# Patient Record
Sex: Female | Born: 1970
Health system: Southern US, Community
[De-identification: ages and names within clinical notes are randomized; demographics above are authoritative.]

## PROBLEM LIST (undated history)

## (undated) DIAGNOSIS — E079 Disorder of thyroid, unspecified: Secondary | ICD-10-CM

## (undated) HISTORY — PX: OTHER SURGICAL HISTORY: SHX169

## (undated) HISTORY — PX: ANTERIOR CRUCIATE LIGAMENT REPAIR: SHX115

---

## 1998-03-31 ENCOUNTER — Other Ambulatory Visit: Admission: RE | Admit: 1998-03-31 | Discharge: 1998-03-31 | Payer: Self-pay | Admitting: Obstetrics and Gynecology

## 1999-02-06 ENCOUNTER — Inpatient Hospital Stay (HOSPITAL_COMMUNITY): Admission: AD | Admit: 1999-02-06 | Discharge: 1999-02-08 | Payer: Self-pay | Admitting: Obstetrics and Gynecology

## 1999-02-06 ENCOUNTER — Encounter (INDEPENDENT_AMBULATORY_CARE_PROVIDER_SITE_OTHER): Payer: Self-pay

## 1999-02-10 ENCOUNTER — Encounter: Admission: RE | Admit: 1999-02-10 | Discharge: 1999-05-11 | Payer: Self-pay | Admitting: Obstetrics and Gynecology

## 1999-03-06 ENCOUNTER — Other Ambulatory Visit: Admission: RE | Admit: 1999-03-06 | Discharge: 1999-03-06 | Payer: Self-pay | Admitting: Obstetrics and Gynecology

## 2000-03-30 ENCOUNTER — Other Ambulatory Visit: Admission: RE | Admit: 2000-03-30 | Discharge: 2000-03-30 | Payer: Self-pay | Admitting: Obstetrics and Gynecology

## 2001-03-03 ENCOUNTER — Encounter: Payer: Self-pay | Admitting: Family Medicine

## 2001-03-03 ENCOUNTER — Encounter: Admission: RE | Admit: 2001-03-03 | Discharge: 2001-03-03 | Payer: Self-pay | Admitting: Family Medicine

## 2002-04-23 ENCOUNTER — Encounter (INDEPENDENT_AMBULATORY_CARE_PROVIDER_SITE_OTHER): Payer: Self-pay | Admitting: Specialist

## 2002-04-23 ENCOUNTER — Inpatient Hospital Stay (HOSPITAL_COMMUNITY): Admission: AD | Admit: 2002-04-23 | Discharge: 2002-04-24 | Payer: Self-pay | Admitting: Obstetrics and Gynecology

## 2003-11-21 ENCOUNTER — Ambulatory Visit: Payer: Self-pay | Admitting: Endocrinology

## 2004-04-22 ENCOUNTER — Ambulatory Visit: Payer: Self-pay | Admitting: Endocrinology

## 2004-06-15 ENCOUNTER — Ambulatory Visit: Payer: Self-pay | Admitting: Endocrinology

## 2004-07-29 ENCOUNTER — Ambulatory Visit: Payer: Self-pay | Admitting: Endocrinology

## 2004-10-09 ENCOUNTER — Encounter: Admission: RE | Admit: 2004-10-09 | Discharge: 2004-10-09 | Payer: Self-pay | Admitting: Obstetrics and Gynecology

## 2005-04-21 ENCOUNTER — Other Ambulatory Visit: Admission: RE | Admit: 2005-04-21 | Discharge: 2005-04-21 | Payer: Self-pay | Admitting: Family Medicine

## 2005-12-17 ENCOUNTER — Encounter: Admission: RE | Admit: 2005-12-17 | Discharge: 2005-12-17 | Payer: Self-pay | Admitting: Family Medicine

## 2006-04-25 ENCOUNTER — Other Ambulatory Visit: Admission: RE | Admit: 2006-04-25 | Discharge: 2006-04-25 | Payer: Self-pay | Admitting: Family Medicine

## 2007-04-25 ENCOUNTER — Other Ambulatory Visit: Admission: RE | Admit: 2007-04-25 | Discharge: 2007-04-25 | Payer: Self-pay | Admitting: Family Medicine

## 2008-09-05 ENCOUNTER — Other Ambulatory Visit: Admission: RE | Admit: 2008-09-05 | Discharge: 2008-09-05 | Payer: Self-pay | Admitting: Family Medicine

## 2009-05-05 ENCOUNTER — Encounter: Admission: RE | Admit: 2009-05-05 | Discharge: 2009-05-05 | Payer: Self-pay | Admitting: Family Medicine

## 2009-09-10 ENCOUNTER — Other Ambulatory Visit: Admission: RE | Admit: 2009-09-10 | Discharge: 2009-09-10 | Payer: Self-pay | Admitting: Family Medicine

## 2010-04-14 ENCOUNTER — Other Ambulatory Visit: Payer: Self-pay | Admitting: Family Medicine

## 2010-04-14 DIAGNOSIS — Z1231 Encounter for screening mammogram for malignant neoplasm of breast: Secondary | ICD-10-CM

## 2010-05-07 ENCOUNTER — Ambulatory Visit: Payer: Self-pay

## 2010-05-22 NOTE — Discharge Summary (Signed)
[facility]  Patient:    Dana Harmon                        MRN: [id number] Adm. Date:  [id number] Disc. Date: [id number] Attending:  Maxie Better                           Discharge Summary  ADMISSION DIAGNOSES:          1. Advanced cervical dilatation.                               2. Term gestation.                               3. Hypothyroidism.                               4. Bilateral fetal hydrocele.  DISCHARGE DIAGNOSES:          1. Term gestation, delivered.                               2. Hypothyroidism.                               3. Vacuum-assisted vaginal delivery.                               4. Repair of midline episiotomy.  HISTORY OF PRESENT ILLNESS:   This is a 40 year old, gravida 1, para 0, female t 39-3/[redacted] weeks gestation admitted for induction of labor secondary to advanced cervical dilatation.  The patient had presented for routine obstetrical care on  February 06, 1999, and was found to be 5 cm, 90%, and +1 station.  The patient had been followed by serial ultrasounds due to grade III placenta initially noted around 28 weeks.  Her pregnancy course had otherwise been unremarkable.  Blood ype is A positive, antibody screen negative, RPR nonreactive.  Rubella immune.  The  patient has history of hypothyroidism controlled on Synthroid and managed by Lindell Spar. Chestine Spore, M.D.  HOSPITAL COURSE:              The patient was admitted and placed on a monitor.  Reactive NST was noted.  Artificial rupture of membranes was performed.  Clear amniotic fluid present.  Her Group B Strep culture was negative.  The patient had onset of labor about 20 minutes after rupture of membranes.  She was fully dilated an hour and 15 minutes later.  The patient pushed for 2+ hours without any epidural anesthesia and was thought to have inadequate pulsatile force despite trying different positions for pushing.  Dressing remained reactive.   Contractions were every two minutes.  Epidural subsequently placed.  Vaginal examination, the patient was fully, +1 caput, 2+ station, left occipitoanterior presentation with not much forcep noted with contractions on digital examination or with palpation of the abdomen.  Therefore, an intrauterine pressure catheter was placed.  The [place] units were 20 per contractions.  Each contraction were then two to three minutes. It was felt that the patient had arrest of  descent secondary to inadequate contractile pattern and strength.  Pitocin augmentation was started with high dose protocol with improvement in the uterine contraction pattern and strength.  The  patient began pushing.  The patient pushed for about two hours.  Her second stage at this point was 7+ hours.  Concern arose about a possible increased uterine atony with resultant postpartum hemorrhage.  It was discussed with the patient. Advised vacuum extraction.  At that time her cervical examination was fully, +3 station, with +1 caput, and left occipitoanterior, visible with pushing.  The bladder was catheterized.  The patient agreed for vacuum, pulled with four contractions, popped off of the vacuum x 1.  Vaginal delivery occured direct occipitoanterior, live ale over a midline episiotomy at 8:25 p.m.  Pediatricians were in attendance.  The fetal heart rate had decreased to 105 with pushing during the vacuum procedure.  Apgars of 8 and 9.  Cord pH was 7.18.  The placenta was spontaneous, intact, and sent to pathology.  It was mature placenta with a small infarct and a 7.5 cm accessory lobe.  The midline episiotomy was inspected and no extension noted. o cervical or vaginal lacerations were noted.  The episiotomy was repaired in layers. Weight of the baby was 8 pounds 3 ounces.  She had an unremarkable postpartum course.  CBC on postpartum day #1 showed a hematocrit of 34.7, hemoglobin 12.1,  white count 15.2,  and platelet count 181,000.  The patient was continued on her  Synthroid dose as prepregnancy.  She was stable for discharge on postpartum day #2.  DISPOSITION:                  Home.  CONDITION ON DISCHARGE:       Stable.  DISCHARGE MEDICATIONS:        1. Prenatal vitamins one p.o. q.d.                               2. Motrin 800 mg every 6 to 8 hours p.r.n. pain.                               3. Continue with Synthroid dose as previously                                  prescribed.  DISCHARGE INSTRUCTIONS:       Call for temperature greater than or equal to 100.4, soaking a regular pad every hour or more frequently, nothing per vagina for four to six weeks, breast redness or pain. DD:  03/07/99 TD:  03/09/99 Job: [id number]

## 2010-06-03 ENCOUNTER — Ambulatory Visit: Payer: Self-pay

## 2010-06-10 ENCOUNTER — Ambulatory Visit: Payer: Self-pay

## 2010-06-10 ENCOUNTER — Ambulatory Visit
Admission: RE | Admit: 2010-06-10 | Discharge: 2010-06-10 | Disposition: A | Discharge status: Home / Self Care | Payer: BC Managed Care – PPO | Source: Ambulatory Visit | Attending: Family Medicine | Admitting: Family Medicine

## 2010-06-10 DIAGNOSIS — Z1231 Encounter for screening mammogram for malignant neoplasm of breast: Secondary | ICD-10-CM

## 2011-09-21 ENCOUNTER — Other Ambulatory Visit: Payer: Self-pay | Admitting: Family Medicine

## 2011-09-21 DIAGNOSIS — Z1231 Encounter for screening mammogram for malignant neoplasm of breast: Secondary | ICD-10-CM

## 2011-10-15 ENCOUNTER — Ambulatory Visit: Payer: BC Managed Care – PPO

## 2011-11-05 ENCOUNTER — Ambulatory Visit: Payer: BC Managed Care – PPO

## 2012-02-04 ENCOUNTER — Ambulatory Visit
Admission: RE | Admit: 2012-02-04 | Discharge: 2012-02-04 | Disposition: A | Discharge status: Home / Self Care | Payer: BC Managed Care – PPO | Source: Ambulatory Visit | Attending: Family Medicine | Admitting: Family Medicine

## 2012-02-04 DIAGNOSIS — Z1231 Encounter for screening mammogram for malignant neoplasm of breast: Secondary | ICD-10-CM

## 2012-10-05 ENCOUNTER — Other Ambulatory Visit: Payer: Self-pay | Admitting: Family Medicine

## 2012-10-05 ENCOUNTER — Other Ambulatory Visit (HOSPITAL_COMMUNITY)
Admission: RE | Admit: 2012-10-05 | Discharge: 2012-10-05 | Disposition: A | Discharge status: Home / Self Care | Payer: BC Managed Care – PPO | Source: Ambulatory Visit | Attending: Family Medicine | Admitting: Family Medicine

## 2012-10-05 DIAGNOSIS — Z124 Encounter for screening for malignant neoplasm of cervix: Secondary | ICD-10-CM | POA: Insufficient documentation

## 2015-01-09 ENCOUNTER — Other Ambulatory Visit: Payer: Self-pay

## 2015-01-09 DIAGNOSIS — Z1231 Encounter for screening mammogram for malignant neoplasm of breast: Secondary | ICD-10-CM

## 2015-01-31 ENCOUNTER — Ambulatory Visit
Admission: RE | Admit: 2015-01-31 | Discharge: 2015-01-31 | Disposition: A | Discharge status: Home / Self Care | Payer: BLUE CROSS/BLUE SHIELD | Source: Ambulatory Visit

## 2015-01-31 DIAGNOSIS — Z1231 Encounter for screening mammogram for malignant neoplasm of breast: Secondary | ICD-10-CM

## 2016-12-10 DIAGNOSIS — M25552 Pain in left hip: Secondary | ICD-10-CM | POA: Diagnosis not present

## 2016-12-10 DIAGNOSIS — M545 Low back pain: Secondary | ICD-10-CM | POA: Diagnosis not present

## 2016-12-10 DIAGNOSIS — M5106 Intervertebral disc disorders with myelopathy, lumbar region: Secondary | ICD-10-CM | POA: Diagnosis not present

## 2017-01-18 ENCOUNTER — Other Ambulatory Visit (HOSPITAL_COMMUNITY)
Admission: RE | Admit: 2017-01-18 | Discharge: 2017-01-18 | Disposition: A | Discharge status: Home / Self Care | Payer: BLUE CROSS/BLUE SHIELD | Source: Ambulatory Visit | Attending: Family Medicine | Admitting: Family Medicine

## 2017-01-18 ENCOUNTER — Other Ambulatory Visit: Payer: Self-pay | Admitting: Family Medicine

## 2017-01-18 DIAGNOSIS — Z131 Encounter for screening for diabetes mellitus: Secondary | ICD-10-CM | POA: Diagnosis not present

## 2017-01-18 DIAGNOSIS — Z0000 Encounter for general adult medical examination without abnormal findings: Secondary | ICD-10-CM | POA: Diagnosis not present

## 2017-01-18 DIAGNOSIS — Z23 Encounter for immunization: Secondary | ICD-10-CM | POA: Diagnosis not present

## 2017-01-18 DIAGNOSIS — Z124 Encounter for screening for malignant neoplasm of cervix: Secondary | ICD-10-CM | POA: Insufficient documentation

## 2017-01-18 DIAGNOSIS — E039 Hypothyroidism, unspecified: Secondary | ICD-10-CM | POA: Diagnosis not present

## 2017-01-18 DIAGNOSIS — Z136 Encounter for screening for cardiovascular disorders: Secondary | ICD-10-CM | POA: Diagnosis not present

## 2017-01-20 LAB — CYTOLOGY - PAP
Diagnosis: NEGATIVE
HPV (WINDOPATH): NOT DETECTED

## 2017-03-18 ENCOUNTER — Encounter (HOSPITAL_COMMUNITY): Payer: Self-pay | Admitting: Emergency Medicine

## 2017-03-18 ENCOUNTER — Ambulatory Visit (HOSPITAL_COMMUNITY)
Admission: EM | Admit: 2017-03-18 | Discharge: 2017-03-18 | Disposition: A | Discharge status: Home / Self Care | Payer: BLUE CROSS/BLUE SHIELD | Attending: Family Medicine | Admitting: Family Medicine

## 2017-03-18 DIAGNOSIS — R21 Rash and other nonspecific skin eruption: Secondary | ICD-10-CM | POA: Diagnosis not present

## 2017-03-18 MED ORDER — TRIAMCINOLONE ACETONIDE 0.1 % EX CREA: 1.0000 "application " | TOPICAL_CREAM | Freq: Two times a day (BID) | CUTANEOUS | 0 refills | Status: DC

## 2017-03-18 MED ORDER — MUPIROCIN 2 % EX OINT: 1.0000 "application " | TOPICAL_OINTMENT | Freq: Two times a day (BID) | CUTANEOUS | 0 refills | Status: DC

## 2017-03-18 NOTE — Discharge Instructions (Signed)
Rash looks more from irritation/dry skin. Apply triamcinolone cream to affected area. You can take over the counter antihistamines (allergy medicine) such as zyrtec to help with the itchiness as well. You can use bactroban ointment on the boils at the lower area that could be due to folliculitis. Monitor for spreading redness, increased warmth, fever, follow up for reevaluation.

## 2017-03-18 NOTE — ED Triage Notes (Signed)
PT reports rash on buttocks that may be infected. Has been present for a few weeks

## 2017-03-18 NOTE — ED Provider Notes (Signed)
[facility]    CSN: [id number] Arrival date & time: 03/18/17  1334     History   Chief Complaint Chief Complaint  Patient presents with  . Rash    HPI Dana Harmon is a 47 y.o. female.   47 year old female comes in for few week history of of rash to the buttocks.  States it started out as pruritic rash, and now slightly painful.  Thought at first it was keratosis pilaris that she has on her arms,  and put on cream without improvement.  Started applying Neosporin without improvement and came in for evaluation.  She denies spreading erythema, increased warmth, fever.  Denies changes in hygiene products/skin products.      History reviewed. No pertinent past medical history.  There are no active problems to display for this patient.   Past Surgical History:  Procedure Laterality Date  . ANTERIOR CRUCIATE LIGAMENT REPAIR    . UCL      OB History    No data available       Home Medications    Prior to Admission medications   Medication Sig Start Date End Date Taking? Authorizing Provider  desvenlafaxine (PRISTIQ) 100 MG 24 hr tablet Take 100 mg by mouth daily.   Yes [provider]  levothyroxine (SYNTHROID) 150 MCG tablet Take 150 mcg by mouth daily before breakfast.   Yes [provider]  meloxicam (MOBIC) 15 MG tablet Take 15 mg by mouth daily.   Yes [provider]  UNKNOWN TO PATIENT    Yes [provider]  mupirocin ointment (BACTROBAN) 2 % Apply 1 application topically 2 (two) times daily. 03/18/17   Cathie Hoops, Amy V, PA-C  triamcinolone cream (KENALOG) 0.1 % Apply 1 application topically 2 (two) times daily. 03/18/17   Belinda Fisher, PA-C    Family History No family history on file.  Social History Social History   Tobacco Use  . Smoking status: Not on file  Substance Use Topics  . Alcohol use: Not on file  . Drug use: Not on file     Allergies   Patient has no known allergies.   Review of Systems Review  of Systems  Reason unable to perform ROS: See HPI as above.     Physical Exam Triage Vital Signs ED Triage Vitals  Enc Vitals Group     BP 03/18/17 1439 122/68     Pulse Rate 03/18/17 1439 76     Resp 03/18/17 1439 16     Temp 03/18/17 1439 97.6 F (36.4 C)     Temp Source 03/18/17 1439 Oral     SpO2 03/18/17 1439 100 %     Weight 03/18/17 1438 130 lb (59 kg)     Height --      Head Circumference --      Peak Flow --      Pain Score 03/18/17 1438 7     Pain Loc --      Pain Edu? --      Excl. in GC? --    No data found.  Updated Vital Signs BP 122/68   Pulse 76   Temp 97.6 F (36.4 C) (Oral)   Resp 16   Wt 130 lb (59 kg)   LMP 01/14/2017 Comment: menstrual Q3 months with BC  SpO2 100%     Physical Exam  Constitutional: She is oriented to person, place, and time. She appears well-developed and well-nourished. No distress.  HENT:  Head: Normocephalic and atraumatic.  Eyes: Conjunctivae are normal. Pupils are equal, round, and reactive to light.  Neurological: She is alert and oriented to person, place, and time.  Skin: Skin is warm and dry.  See picture below.  Surrounding erythema without increased warmth or signs of cellulitis.  No obvious tenderness to palpation.  Patient with a few folliculitis on bottom left buttock        UC Treatments / Results  Labs (all labs ordered are listed, but only abnormal results are displayed) Labs Reviewed - No data to display  EKG  EKG Interpretation None       Radiology No results found.  Procedures Procedures (including critical care time)  Medications Ordered in UC Medications - No data to display   Initial Impression / Assessment and Plan / UC Course  I have reviewed the triage vital signs and the nursing notes.  Pertinent labs & imaging results that were available during my care of the patient were reviewed by me and considered in my medical decision making (see chart for details).    Exam  consistent with skin irritation/dry skin and wound from scratching. Start triamcinolone on affected area. Bactroban ointment on folliculitis, warm compress. Return precautions given. Patient expresses understanding and agrees to plan.   Final Clinical Impressions(s) / UC Diagnoses   Final diagnoses:  Rash    ED Discharge Orders        Ordered    triamcinolone cream (KENALOG) 0.1 %  2 times daily     03/18/17 1506    mupirocin ointment (BACTROBAN) 2 %  2 times daily     03/18/17 1507        Belinda FisherYu, Amy V, PA-C 03/18/17 1515

## 2017-04-14 DIAGNOSIS — L814 Other melanin hyperpigmentation: Secondary | ICD-10-CM | POA: Diagnosis not present

## 2017-04-14 DIAGNOSIS — L738 Other specified follicular disorders: Secondary | ICD-10-CM | POA: Diagnosis not present

## 2017-04-14 DIAGNOSIS — L821 Other seborrheic keratosis: Secondary | ICD-10-CM | POA: Diagnosis not present

## 2017-04-14 DIAGNOSIS — D229 Melanocytic nevi, unspecified: Secondary | ICD-10-CM | POA: Diagnosis not present

## 2017-06-13 DIAGNOSIS — R208 Other disturbances of skin sensation: Secondary | ICD-10-CM | POA: Diagnosis not present

## 2017-06-13 DIAGNOSIS — B078 Other viral warts: Secondary | ICD-10-CM | POA: Diagnosis not present

## 2017-06-13 DIAGNOSIS — L821 Other seborrheic keratosis: Secondary | ICD-10-CM | POA: Diagnosis not present

## 2017-06-28 DIAGNOSIS — E039 Hypothyroidism, unspecified: Secondary | ICD-10-CM | POA: Diagnosis not present

## 2018-02-21 DIAGNOSIS — Z13220 Encounter for screening for lipoid disorders: Secondary | ICD-10-CM | POA: Diagnosis not present

## 2018-02-21 DIAGNOSIS — Z0000 Encounter for general adult medical examination without abnormal findings: Secondary | ICD-10-CM | POA: Diagnosis not present

## 2018-02-21 DIAGNOSIS — Z131 Encounter for screening for diabetes mellitus: Secondary | ICD-10-CM | POA: Diagnosis not present

## 2018-02-21 DIAGNOSIS — E039 Hypothyroidism, unspecified: Secondary | ICD-10-CM | POA: Diagnosis not present

## 2018-04-27 DIAGNOSIS — E039 Hypothyroidism, unspecified: Secondary | ICD-10-CM | POA: Diagnosis not present

## 2018-06-15 DIAGNOSIS — Z111 Encounter for screening for respiratory tuberculosis: Secondary | ICD-10-CM | POA: Diagnosis not present

## 2018-06-21 DIAGNOSIS — Z1159 Encounter for screening for other viral diseases: Secondary | ICD-10-CM | POA: Diagnosis not present

## 2018-07-04 DIAGNOSIS — D229 Melanocytic nevi, unspecified: Secondary | ICD-10-CM | POA: Diagnosis not present

## 2018-07-04 DIAGNOSIS — I788 Other diseases of capillaries: Secondary | ICD-10-CM | POA: Diagnosis not present

## 2018-07-04 DIAGNOSIS — L821 Other seborrheic keratosis: Secondary | ICD-10-CM | POA: Diagnosis not present

## 2018-07-04 DIAGNOSIS — L738 Other specified follicular disorders: Secondary | ICD-10-CM | POA: Diagnosis not present

## 2018-08-16 ENCOUNTER — Other Ambulatory Visit: Payer: Self-pay | Admitting: Family Medicine

## 2018-08-16 DIAGNOSIS — Z1231 Encounter for screening mammogram for malignant neoplasm of breast: Secondary | ICD-10-CM

## 2018-09-28 ENCOUNTER — Ambulatory Visit: Payer: BLUE CROSS/BLUE SHIELD

## 2018-10-27 ENCOUNTER — Other Ambulatory Visit: Payer: Self-pay

## 2018-10-27 ENCOUNTER — Ambulatory Visit
Admission: RE | Admit: 2018-10-27 | Discharge: 2018-10-27 | Disposition: A | Discharge status: Home / Self Care | Payer: BC Managed Care – PPO | Source: Ambulatory Visit | Attending: Family Medicine | Admitting: Family Medicine

## 2018-10-27 DIAGNOSIS — Z1231 Encounter for screening mammogram for malignant neoplasm of breast: Secondary | ICD-10-CM

## 2019-01-19 DIAGNOSIS — H52203 Unspecified astigmatism, bilateral: Secondary | ICD-10-CM | POA: Diagnosis not present

## 2019-01-19 DIAGNOSIS — H1789 Other corneal scars and opacities: Secondary | ICD-10-CM | POA: Diagnosis not present

## 2019-03-19 DIAGNOSIS — E039 Hypothyroidism, unspecified: Secondary | ICD-10-CM | POA: Diagnosis not present

## 2019-03-19 DIAGNOSIS — Z0000 Encounter for general adult medical examination without abnormal findings: Secondary | ICD-10-CM | POA: Diagnosis not present

## 2019-03-19 DIAGNOSIS — Z13220 Encounter for screening for lipoid disorders: Secondary | ICD-10-CM | POA: Diagnosis not present

## 2019-07-04 DIAGNOSIS — S0502XA Injury of conjunctiva and corneal abrasion without foreign body, left eye, initial encounter: Secondary | ICD-10-CM | POA: Diagnosis not present

## 2019-08-08 ENCOUNTER — Ambulatory Visit
Admission: RE | Admit: 2019-08-08 | Discharge: 2019-08-08 | Disposition: A | Discharge status: Home / Self Care | Payer: BC Managed Care – PPO | Source: Ambulatory Visit | Attending: Emergency Medicine | Admitting: Emergency Medicine

## 2019-08-08 ENCOUNTER — Other Ambulatory Visit: Payer: Self-pay

## 2019-08-08 VITALS — BP 107/72 | HR 68 | Temp 97.8°F | Resp 16

## 2019-08-08 DIAGNOSIS — J029 Acute pharyngitis, unspecified: Secondary | ICD-10-CM | POA: Diagnosis present

## 2019-08-08 HISTORY — DX: Disorder of thyroid, unspecified: E07.9

## 2019-08-08 LAB — POCT RAPID STREP A (OFFICE): Rapid Strep A Screen: NEGATIVE

## 2019-08-08 NOTE — Discharge Instructions (Signed)

## 2019-08-08 NOTE — ED Provider Notes (Signed)
[facility]    CSN: [id number] Arrival date & time: 08/08/19  1001      History   Chief Complaint Chief Complaint  Patient presents with  . Sore Throat    HPI Dana Harmon is a 49 y.o. female  Subjective:   History was provided by the patient. Dana Harmon is a 49 y.o. female who presents for evaluation of a sore throat. Associated symptoms include none. Onset of symptoms was a few day ago, stable since that time.  She is drinking plenty of fluids. She has had recent close exposure to someone with proven streptococcal pharyngitis. The following portions of the patient's history were reviewed and updated as appropriate: allergies, current medications, past family history, past medical history, past social history, past surgical history and problem list.     Past Medical History:  Diagnosis Date  . Thyroid disease     There are no problems to display for this patient.   Past Surgical History:  Procedure Laterality Date  . ANTERIOR CRUCIATE LIGAMENT REPAIR    . UCL      OB History   No obstetric history on file.      Home Medications    Prior to Admission medications   Medication Sig Start Date End Date Taking? Authorizing Provider  buPROPion (WELLBUTRIN XL) 150 MG 24 hr tablet Take 150 mg by mouth daily.   Yes [provider]  sertraline (ZOLOFT) 50 MG tablet Take 50 mg by mouth daily.   Yes [provider]  levothyroxine (SYNTHROID) 150 MCG tablet Take 150 mcg by mouth daily before breakfast.    [provider]  UNKNOWN TO PATIENT     [provider]    Family History History reviewed. No pertinent family history.  Social History Social History   Tobacco Use  . Smoking status: Never Smoker  . Smokeless tobacco: Never Used  Substance Use Topics  . Alcohol use: Not Currently  . Drug use: Not Currently     Allergies   Patient has no known allergies.   Review of Systems As per  HPI   Physical Exam Triage Vital Signs ED Triage Vitals  Enc Vitals Group     BP      Pulse      Resp      Temp      Temp src      SpO2      Weight      Height      Head Circumference      Peak Flow      Pain Score      Pain Loc      Pain Edu?      Excl. in GC?    No data found.  Updated Vital Signs BP 107/72 (BP Location: Left Arm)   Pulse 68   Temp 97.8 F (36.6 C) (Oral)   Resp 16   SpO2 99%   Visual Acuity Right Eye Distance:   Left Eye Distance:   Bilateral Distance:    Right Eye Near:   Left Eye Near:    Bilateral Near:     Physical Exam Constitutional:      General: She is not in acute distress. HENT:     Head: Normocephalic and atraumatic.     Jaw: There is normal jaw occlusion. No tenderness or pain on movement.     Right Ear: Hearing, tympanic membrane, ear canal and external ear normal. No tenderness. No mastoid  tenderness.     Left Ear: Hearing, tympanic membrane, ear canal and external ear normal. No tenderness. No mastoid tenderness.     Nose: No nasal deformity, septal deviation or nasal tenderness.     Right Turbinates: Not swollen or pale.     Left Turbinates: Not swollen or pale.     Right Sinus: No maxillary sinus tenderness or frontal sinus tenderness.     Left Sinus: No maxillary sinus tenderness or frontal sinus tenderness.     Mouth/Throat:     Lips: Pink. No lesions.     Mouth: Mucous membranes are moist. No injury.     Pharynx: Oropharynx is clear. Uvula midline. No posterior oropharyngeal erythema or uvula swelling.     Comments: no tonsillar exudate or hypertrophy Cardiovascular:     Rate and Rhythm: Normal rate.  Pulmonary:     Effort: Pulmonary effort is normal.  Musculoskeletal:     Cervical back: Normal range of motion and neck supple. No muscular tenderness.  Lymphadenopathy:     Cervical: No cervical adenopathy.  Neurological:     Mental Status: She is alert and oriented to person, place, and time.      UC  Treatments / Results  Labs (all labs ordered are listed, but only abnormal results are displayed) Labs Reviewed  POCT RAPID STREP A (OFFICE) - Normal  CULTURE, GROUP A STREP [facility])    EKG   Radiology No results found.  Procedures Procedures (including critical care time)  Medications Ordered in UC Medications - No data to display  Initial Impression / Assessment and Plan / UC Course  I have reviewed the triage vital signs and the nursing notes.  Pertinent labs & imaging results that were available during my care of the patient were reviewed by me and considered in my medical decision making (see chart for details).     Plan:  Use of OTC analgesics recommended as well as salt water gargles. Follow up as needed. Final Clinical Impressions(s) / UC Diagnoses   Final diagnoses:  Sore throat     Discharge Instructions     Your rapid strep test was negative today.  The culture is pending.  Please look on your MyChart for test results.   We will notify you if the culture positive and outline a treatment plan at that time.   Please continue Tylenol and/or Ibuprofen as needed for fever, pain.  May try warm salt water gargles, cepacol lozenges, throat spray, warm tea or water with lemon/honey, or OTC cold relief medicine for throat discomfort.   For congestion: take a daily anti-histamine like Zyrtec, Claritin, and a oral decongestant to help with post nasal drip that may be irritating your throat.   It is important to stay hydrated: drink plenty of fluids (primarily water) to keep your throat moisturized and help further relieve irritation/discomfort.     ED Prescriptions    None     PDMP not reviewed this encounter.   Hall-Potvin, [place], [place] 08/08/19 1050

## 2019-08-08 NOTE — ED Triage Notes (Signed)
Pt c/o sore throat and headaches for past few days. States her daughter is strep positive.

## 2019-08-10 LAB — CULTURE, GROUP A STREP (THRC)

## 2020-02-25 ENCOUNTER — Other Ambulatory Visit: Payer: Self-pay | Admitting: Family Medicine

## 2020-02-25 DIAGNOSIS — Z1231 Encounter for screening mammogram for malignant neoplasm of breast: Secondary | ICD-10-CM

## 2020-04-15 ENCOUNTER — Ambulatory Visit
Admission: RE | Admit: 2020-04-15 | Discharge: 2020-04-15 | Disposition: A | Discharge status: Home / Self Care | Payer: BC Managed Care – PPO | Source: Ambulatory Visit | Attending: Family Medicine | Admitting: Family Medicine

## 2020-04-15 ENCOUNTER — Other Ambulatory Visit: Payer: Self-pay

## 2020-04-15 DIAGNOSIS — Z1231 Encounter for screening mammogram for malignant neoplasm of breast: Secondary | ICD-10-CM

## 2021-06-22 DIAGNOSIS — E039 Hypothyroidism, unspecified: Secondary | ICD-10-CM | POA: Diagnosis not present

## 2021-07-18 DIAGNOSIS — Z111 Encounter for screening for respiratory tuberculosis: Secondary | ICD-10-CM | POA: Diagnosis not present

## 2021-07-20 DIAGNOSIS — Z111 Encounter for screening for respiratory tuberculosis: Secondary | ICD-10-CM | POA: Diagnosis not present

## 2021-07-22 ENCOUNTER — Other Ambulatory Visit: Payer: Self-pay | Admitting: Family Medicine

## 2021-07-22 DIAGNOSIS — Z1231 Encounter for screening mammogram for malignant neoplasm of breast: Secondary | ICD-10-CM

## 2021-07-23 ENCOUNTER — Ambulatory Visit
Admission: RE | Admit: 2021-07-23 | Discharge: 2021-07-23 | Disposition: A | Discharge status: Home / Self Care | Payer: BC Managed Care – PPO | Source: Ambulatory Visit | Attending: Family Medicine | Admitting: Family Medicine

## 2021-07-23 DIAGNOSIS — Z1231 Encounter for screening mammogram for malignant neoplasm of breast: Secondary | ICD-10-CM

## 2021-08-14 DIAGNOSIS — Z131 Encounter for screening for diabetes mellitus: Secondary | ICD-10-CM | POA: Diagnosis not present

## 2021-08-14 DIAGNOSIS — F410 Panic disorder [episodic paroxysmal anxiety] without agoraphobia: Secondary | ICD-10-CM | POA: Diagnosis not present

## 2021-08-14 DIAGNOSIS — Z13220 Encounter for screening for lipoid disorders: Secondary | ICD-10-CM | POA: Diagnosis not present

## 2021-08-14 DIAGNOSIS — E039 Hypothyroidism, unspecified: Secondary | ICD-10-CM | POA: Diagnosis not present

## 2021-08-14 DIAGNOSIS — Z0000 Encounter for general adult medical examination without abnormal findings: Secondary | ICD-10-CM | POA: Diagnosis not present

## 2021-08-14 DIAGNOSIS — F4321 Adjustment disorder with depressed mood: Secondary | ICD-10-CM | POA: Diagnosis not present

## 2021-10-07 DIAGNOSIS — H5213 Myopia, bilateral: Secondary | ICD-10-CM | POA: Diagnosis not present

## 2021-12-08 DIAGNOSIS — M25572 Pain in left ankle and joints of left foot: Secondary | ICD-10-CM | POA: Diagnosis not present

## 2022-05-15 IMAGING — MG MM DIGITAL SCREENING BILAT W/ TOMO AND CAD
8 series · 9 of 24 positions shown · non-contrast
Comparison: Previous exam(s).

CLINICAL DATA: Screening.

EXAM:
DIGITAL SCREENING BILATERAL MAMMOGRAM WITH TOMOSYNTHESIS AND CAD
TECHNIQUE: Bilateral screening digital craniocaudal and mediolateral oblique
mammograms were obtained. Bilateral screening digital breast
tomosynthesis was performed. The images were evaluated with
computer-aided detection.

[R CC synth-2D]
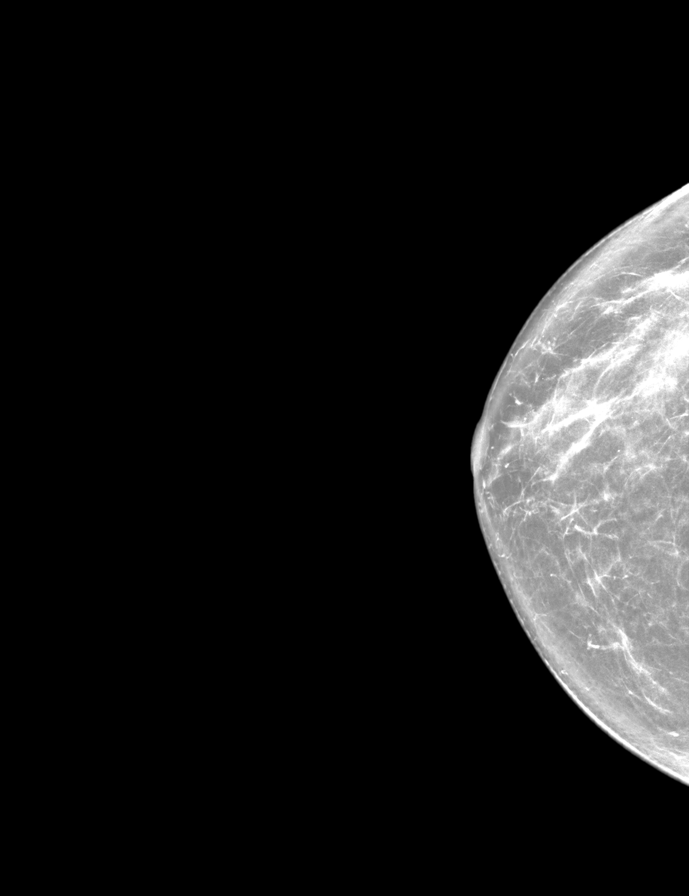

[L CC synth-2D]
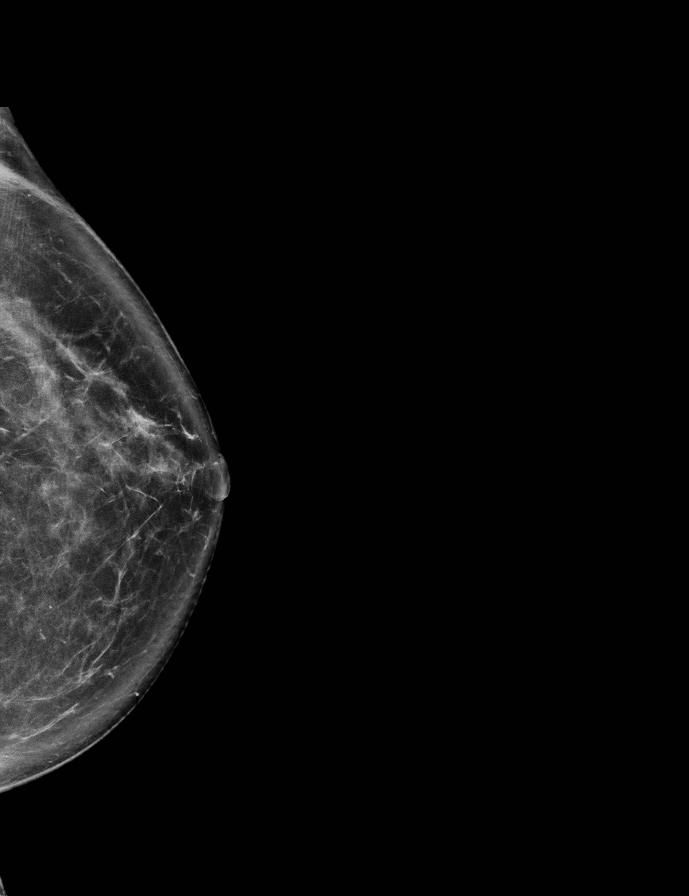

[R MLO synth-2D]
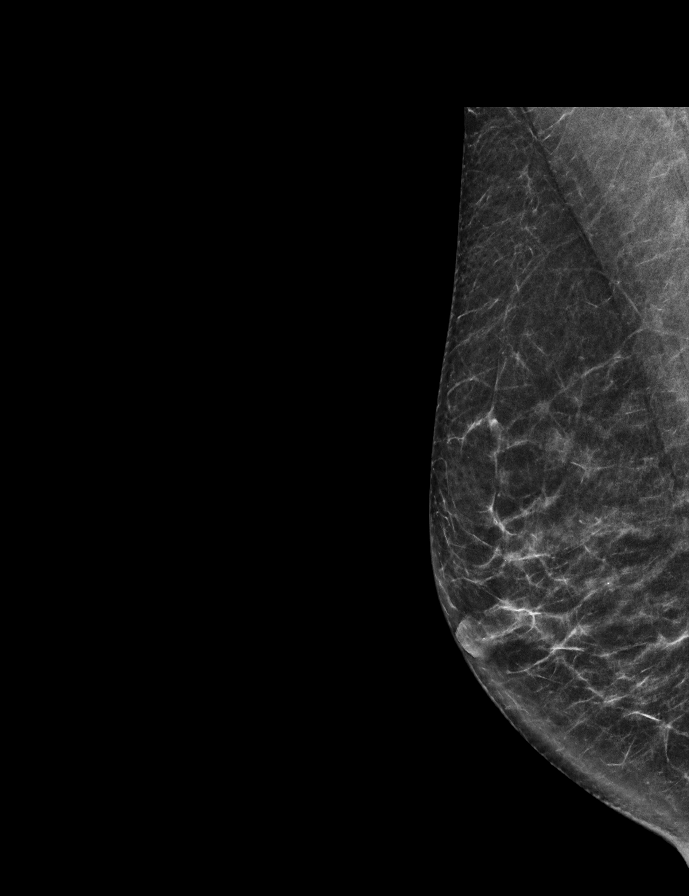

[L MLO synth-2D]
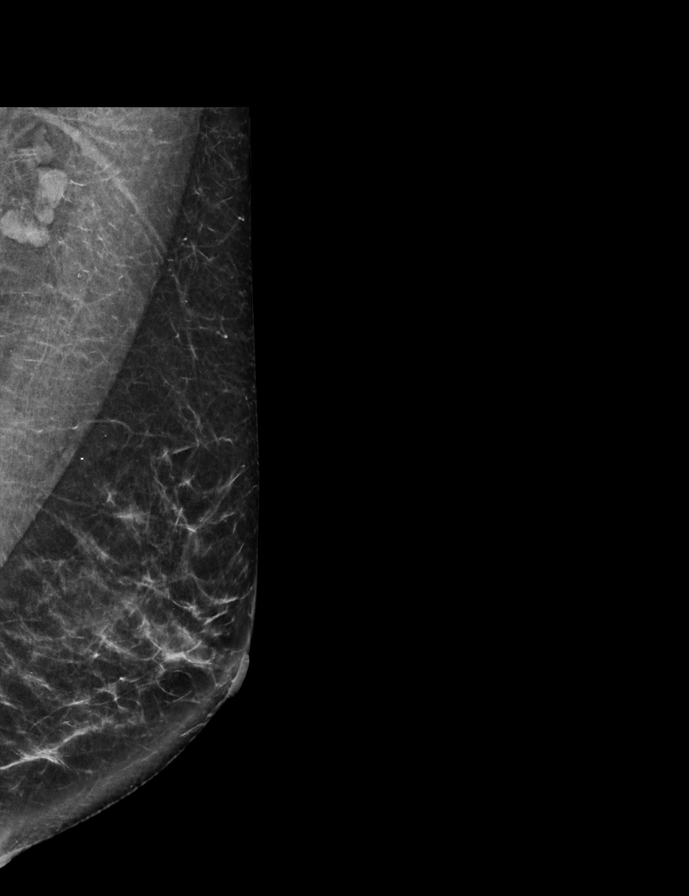

[R MLO tomo · 2 of 58 frames shown]
[frame 19/58]
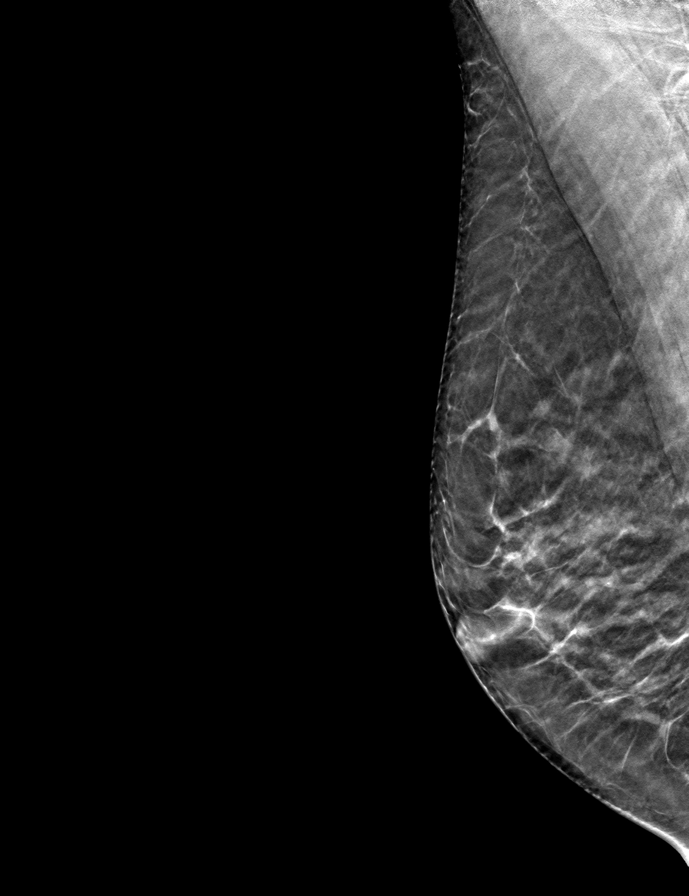
[frame 29/58]
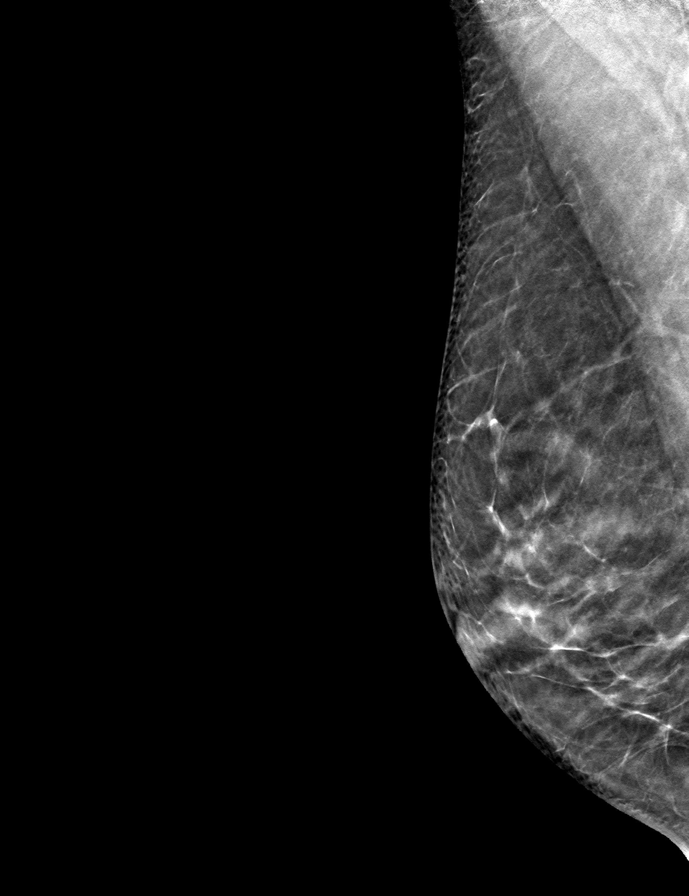

[L MLO tomo · tomo slice 36/71.0]
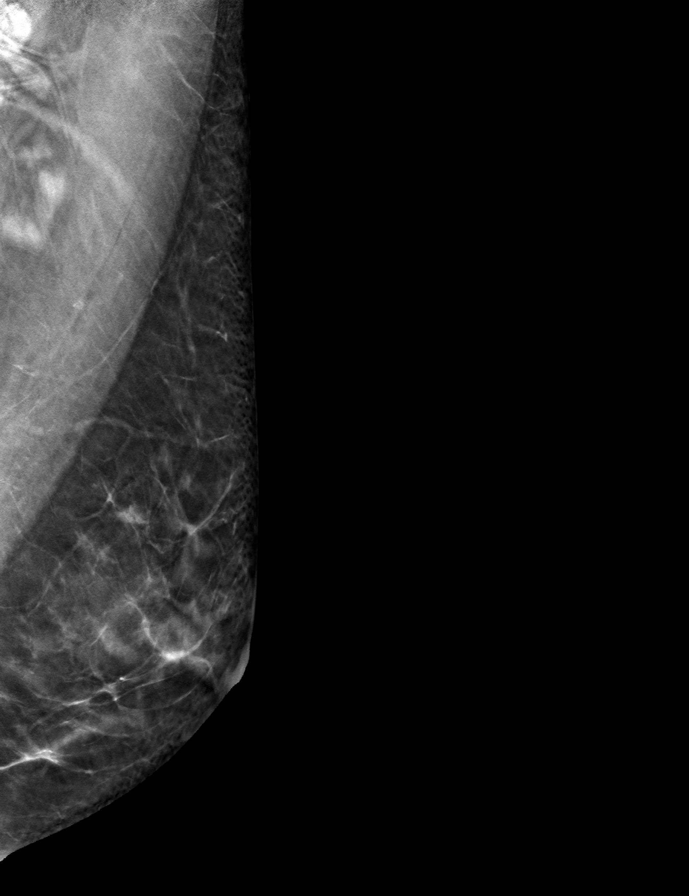

[R CC tomo · tomo slice 34/67.0]
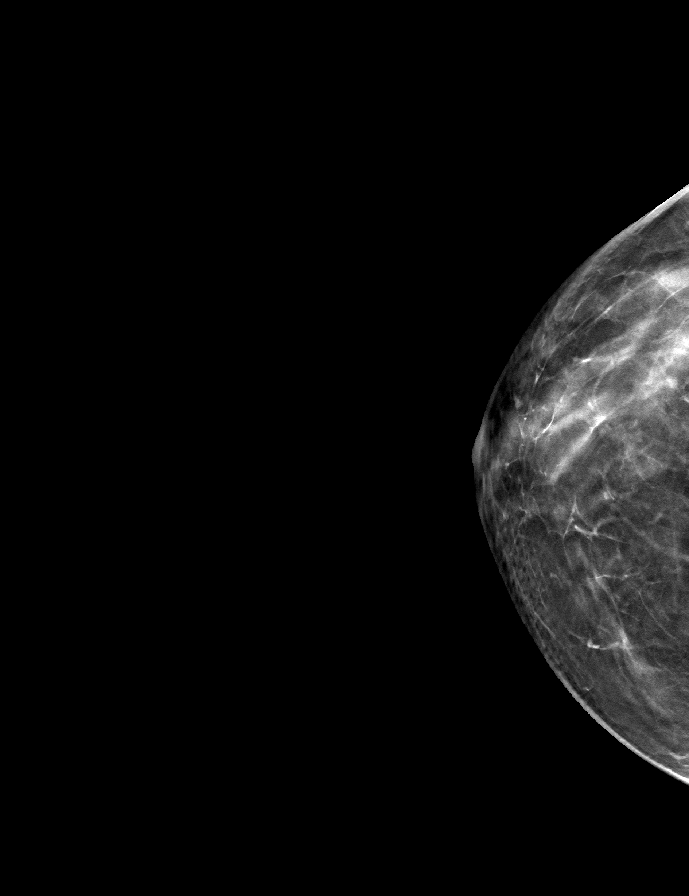

[L CC tomo · tomo slice 39/76.0]
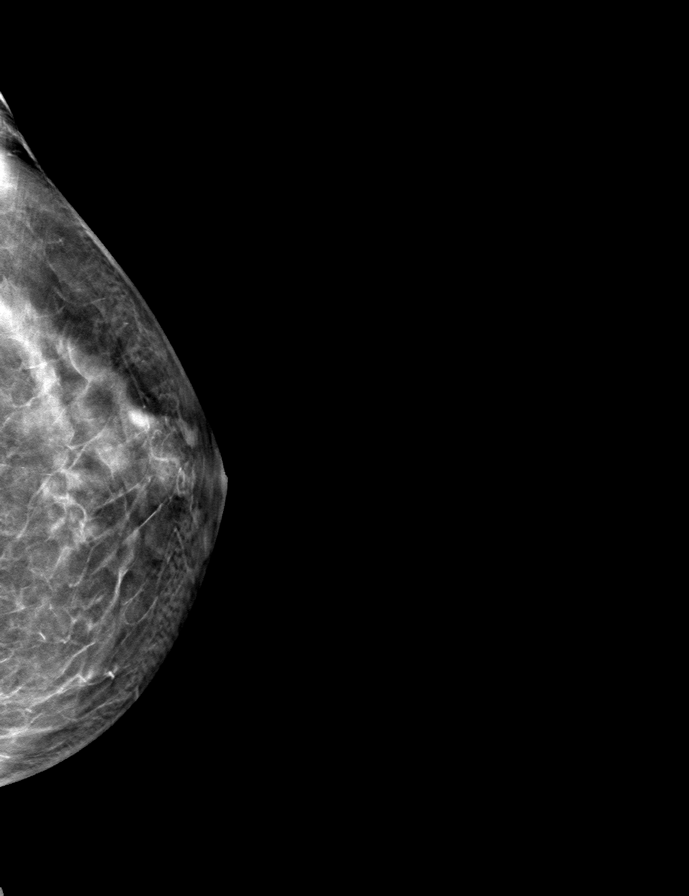

[9 of 24 positions shown; findings below may reference images not displayed]

ACR Breast Density Category c: The breast tissue is heterogeneously
dense, which may obscure small masses.
FINDINGS: There are no findings suspicious for malignancy. The images were
evaluated with computer-aided detection.
IMPRESSION: No mammographic evidence of malignancy. A result letter of this
screening mammogram will be mailed directly to the patient.

RECOMMENDATION:
Screening mammogram in one year. (Code:T4-5-GWO)

BI-RADS CATEGORY  1: Negative.

## 2022-06-16 ENCOUNTER — Other Ambulatory Visit: Payer: Self-pay | Admitting: Internal Medicine

## 2022-06-16 DIAGNOSIS — Z0000 Encounter for general adult medical examination without abnormal findings: Secondary | ICD-10-CM

## 2022-07-27 ENCOUNTER — Ambulatory Visit
Admission: RE | Admit: 2022-07-27 | Discharge: 2022-07-27 | Disposition: A | Discharge status: Home / Self Care | Payer: BC Managed Care – PPO | Source: Ambulatory Visit | Attending: Internal Medicine | Admitting: Internal Medicine

## 2022-07-27 DIAGNOSIS — Z1231 Encounter for screening mammogram for malignant neoplasm of breast: Secondary | ICD-10-CM | POA: Diagnosis not present

## 2022-07-27 DIAGNOSIS — Z0000 Encounter for general adult medical examination without abnormal findings: Secondary | ICD-10-CM

## 2022-08-27 DIAGNOSIS — Z0000 Encounter for general adult medical examination without abnormal findings: Secondary | ICD-10-CM | POA: Diagnosis not present

## 2022-08-27 DIAGNOSIS — Z124 Encounter for screening for malignant neoplasm of cervix: Secondary | ICD-10-CM | POA: Diagnosis not present

## 2022-08-27 DIAGNOSIS — Z13220 Encounter for screening for lipoid disorders: Secondary | ICD-10-CM | POA: Diagnosis not present

## 2022-08-27 DIAGNOSIS — N898 Other specified noninflammatory disorders of vagina: Secondary | ICD-10-CM | POA: Diagnosis not present

## 2022-08-27 DIAGNOSIS — E039 Hypothyroidism, unspecified: Secondary | ICD-10-CM | POA: Diagnosis not present

## 2022-08-30 ENCOUNTER — Other Ambulatory Visit: Payer: Self-pay | Admitting: Internal Medicine

## 2022-08-30 DIAGNOSIS — N941 Unspecified dyspareunia: Secondary | ICD-10-CM

## 2022-09-14 ENCOUNTER — Ambulatory Visit
Admission: RE | Admit: 2022-09-14 | Discharge: 2022-09-14 | Disposition: A | Discharge status: Home / Self Care | Payer: BC Managed Care – PPO | Source: Ambulatory Visit | Attending: Internal Medicine | Admitting: Internal Medicine

## 2022-09-14 DIAGNOSIS — D251 Intramural leiomyoma of uterus: Secondary | ICD-10-CM | POA: Diagnosis not present

## 2022-09-14 DIAGNOSIS — N941 Unspecified dyspareunia: Secondary | ICD-10-CM | POA: Diagnosis not present

## 2022-10-08 DIAGNOSIS — K581 Irritable bowel syndrome with constipation: Secondary | ICD-10-CM | POA: Diagnosis not present

## 2022-10-08 DIAGNOSIS — Z131 Encounter for screening for diabetes mellitus: Secondary | ICD-10-CM | POA: Diagnosis not present

## 2022-10-08 DIAGNOSIS — R519 Headache, unspecified: Secondary | ICD-10-CM | POA: Diagnosis not present

## 2022-10-08 DIAGNOSIS — E611 Iron deficiency: Secondary | ICD-10-CM | POA: Diagnosis not present

## 2022-10-08 DIAGNOSIS — E559 Vitamin D deficiency, unspecified: Secondary | ICD-10-CM | POA: Diagnosis not present

## 2022-10-08 DIAGNOSIS — E785 Hyperlipidemia, unspecified: Secondary | ICD-10-CM | POA: Diagnosis not present

## 2022-10-08 DIAGNOSIS — E538 Deficiency of other specified B group vitamins: Secondary | ICD-10-CM | POA: Diagnosis not present

## 2022-10-08 DIAGNOSIS — E039 Hypothyroidism, unspecified: Secondary | ICD-10-CM | POA: Diagnosis not present

## 2022-10-08 DIAGNOSIS — E063 Autoimmune thyroiditis: Secondary | ICD-10-CM | POA: Diagnosis not present

## 2022-11-05 DIAGNOSIS — H524 Presbyopia: Secondary | ICD-10-CM | POA: Diagnosis not present

## 2022-11-05 DIAGNOSIS — E063 Autoimmune thyroiditis: Secondary | ICD-10-CM | POA: Diagnosis not present

## 2022-11-05 DIAGNOSIS — E039 Hypothyroidism, unspecified: Secondary | ICD-10-CM | POA: Diagnosis not present

## 2022-11-05 DIAGNOSIS — E785 Hyperlipidemia, unspecified: Secondary | ICD-10-CM | POA: Diagnosis not present

## 2022-11-05 DIAGNOSIS — H1789 Other corneal scars and opacities: Secondary | ICD-10-CM | POA: Diagnosis not present

## 2022-11-05 DIAGNOSIS — R519 Headache, unspecified: Secondary | ICD-10-CM | POA: Diagnosis not present

## 2022-11-19 DIAGNOSIS — E039 Hypothyroidism, unspecified: Secondary | ICD-10-CM | POA: Diagnosis not present

## 2022-12-17 DIAGNOSIS — K5909 Other constipation: Secondary | ICD-10-CM | POA: Diagnosis not present

## 2022-12-17 DIAGNOSIS — N959 Unspecified menopausal and perimenopausal disorder: Secondary | ICD-10-CM | POA: Diagnosis not present

## 2022-12-17 DIAGNOSIS — N952 Postmenopausal atrophic vaginitis: Secondary | ICD-10-CM | POA: Diagnosis not present

## 2022-12-17 DIAGNOSIS — D251 Intramural leiomyoma of uterus: Secondary | ICD-10-CM | POA: Diagnosis not present

## 2023-01-07 DIAGNOSIS — E039 Hypothyroidism, unspecified: Secondary | ICD-10-CM | POA: Diagnosis not present

## 2023-01-25 DIAGNOSIS — N959 Unspecified menopausal and perimenopausal disorder: Secondary | ICD-10-CM | POA: Diagnosis not present

## 2023-01-28 DIAGNOSIS — K5900 Constipation, unspecified: Secondary | ICD-10-CM | POA: Diagnosis not present

## 2023-02-25 DIAGNOSIS — E039 Hypothyroidism, unspecified: Secondary | ICD-10-CM | POA: Diagnosis not present

## 2023-03-04 DIAGNOSIS — N952 Postmenopausal atrophic vaginitis: Secondary | ICD-10-CM | POA: Diagnosis not present

## 2023-03-04 DIAGNOSIS — N959 Unspecified menopausal and perimenopausal disorder: Secondary | ICD-10-CM | POA: Diagnosis not present

## 2023-04-01 DIAGNOSIS — E039 Hypothyroidism, unspecified: Secondary | ICD-10-CM | POA: Diagnosis not present

## 2023-05-17 ENCOUNTER — Ambulatory Visit (HOSPITAL_BASED_OUTPATIENT_CLINIC_OR_DEPARTMENT_OTHER)
Admission: RE | Admit: 2023-05-17 | Discharge: 2023-05-17 | Disposition: A | Discharge status: Home / Self Care | Source: Ambulatory Visit | Attending: Internal Medicine | Admitting: Internal Medicine

## 2023-05-17 ENCOUNTER — Encounter (HOSPITAL_BASED_OUTPATIENT_CLINIC_OR_DEPARTMENT_OTHER): Payer: Self-pay

## 2023-05-17 ENCOUNTER — Other Ambulatory Visit: Payer: Self-pay | Admitting: Internal Medicine

## 2023-05-17 DIAGNOSIS — F418 Other specified anxiety disorders: Secondary | ICD-10-CM | POA: Diagnosis not present

## 2023-05-17 DIAGNOSIS — E039 Hypothyroidism, unspecified: Secondary | ICD-10-CM | POA: Diagnosis not present

## 2023-05-17 DIAGNOSIS — R1032 Left lower quadrant pain: Secondary | ICD-10-CM | POA: Insufficient documentation

## 2023-05-17 DIAGNOSIS — K5900 Constipation, unspecified: Secondary | ICD-10-CM | POA: Diagnosis not present

## 2023-05-17 DIAGNOSIS — R630 Anorexia: Secondary | ICD-10-CM | POA: Diagnosis not present

## 2023-05-17 DIAGNOSIS — R110 Nausea: Secondary | ICD-10-CM | POA: Diagnosis not present

## 2023-05-17 MED ORDER — IOHEXOL 300 MG/ML  SOLN
80.0000 mL | Freq: Once | INTRAMUSCULAR | Status: AC | PRN
  Administered 2023-05-17: 80 mL via INTRAVENOUS

## 2023-05-18 ENCOUNTER — Observation Stay (HOSPITAL_COMMUNITY)
Admission: EM | Admit: 2023-05-18 | Discharge: 2023-05-19 | Disposition: A | Discharge status: Home / Self Care | Attending: Internal Medicine | Admitting: Internal Medicine

## 2023-05-18 ENCOUNTER — Other Ambulatory Visit: Payer: Self-pay

## 2023-05-18 ENCOUNTER — Other Ambulatory Visit

## 2023-05-18 ENCOUNTER — Emergency Department (HOSPITAL_COMMUNITY)

## 2023-05-18 DIAGNOSIS — Z79899 Other long term (current) drug therapy: Secondary | ICD-10-CM | POA: Insufficient documentation

## 2023-05-18 DIAGNOSIS — R1084 Generalized abdominal pain: Secondary | ICD-10-CM | POA: Diagnosis not present

## 2023-05-18 DIAGNOSIS — R112 Nausea with vomiting, unspecified: Secondary | ICD-10-CM | POA: Diagnosis not present

## 2023-05-18 DIAGNOSIS — E871 Hypo-osmolality and hyponatremia: Principal | ICD-10-CM | POA: Diagnosis present

## 2023-05-18 DIAGNOSIS — N858 Other specified noninflammatory disorders of uterus: Secondary | ICD-10-CM | POA: Diagnosis not present

## 2023-05-18 DIAGNOSIS — E039 Hypothyroidism, unspecified: Secondary | ICD-10-CM | POA: Diagnosis present

## 2023-05-18 DIAGNOSIS — R197 Diarrhea, unspecified: Secondary | ICD-10-CM | POA: Diagnosis not present

## 2023-05-18 DIAGNOSIS — F419 Anxiety disorder, unspecified: Secondary | ICD-10-CM | POA: Insufficient documentation

## 2023-05-18 DIAGNOSIS — Z1152 Encounter for screening for COVID-19: Secondary | ICD-10-CM | POA: Insufficient documentation

## 2023-05-18 DIAGNOSIS — R8290 Unspecified abnormal findings in urine: Secondary | ICD-10-CM | POA: Diagnosis not present

## 2023-05-18 DIAGNOSIS — R102 Pelvic and perineal pain: Secondary | ICD-10-CM | POA: Diagnosis not present

## 2023-05-18 DIAGNOSIS — R110 Nausea: Secondary | ICD-10-CM | POA: Diagnosis not present

## 2023-05-18 DIAGNOSIS — R1030 Lower abdominal pain, unspecified: Secondary | ICD-10-CM | POA: Diagnosis not present

## 2023-05-18 DIAGNOSIS — K5900 Constipation, unspecified: Secondary | ICD-10-CM | POA: Diagnosis not present

## 2023-05-18 LAB — CBC WITH DIFFERENTIAL/PLATELET
Abs Immature Granulocytes: 0.01 10*3/uL (ref 0.00–0.07)
Basophils Absolute: 0 10*3/uL (ref 0.0–0.1)
Basophils Relative: 1 %
Eosinophils Absolute: 0.3 10*3/uL (ref 0.0–0.5)
Eosinophils Relative: 5 %
HCT: 34 % — ABNORMAL LOW (ref 36.0–46.0)
Hemoglobin: 12.4 g/dL (ref 12.0–15.0)
Immature Granulocytes: 0 %
Lymphocytes Relative: 40 %
Lymphs Abs: 2.2 10*3/uL (ref 0.7–4.0)
MCH: 31.2 pg (ref 26.0–34.0)
MCHC: 36.5 g/dL — ABNORMAL HIGH (ref 30.0–36.0)
MCV: 85.4 fL (ref 80.0–100.0)
Monocytes Absolute: 0.6 10*3/uL (ref 0.1–1.0)
Monocytes Relative: 11 %
Neutro Abs: 2.5 10*3/uL (ref 1.7–7.7)
Neutrophils Relative %: 43 %
Platelets: 200 10*3/uL (ref 150–400)
RBC: 3.98 MIL/uL (ref 3.87–5.11)
RDW: 11.2 % — ABNORMAL LOW (ref 11.5–15.5)
WBC: 5.6 10*3/uL (ref 4.0–10.5)
nRBC: 0 % (ref 0.0–0.2)

## 2023-05-18 LAB — URINALYSIS, ROUTINE W REFLEX MICROSCOPIC
Bilirubin Urine: NEGATIVE
Glucose, UA: NEGATIVE mg/dL
Hgb urine dipstick: NEGATIVE
Ketones, ur: NEGATIVE mg/dL
Nitrite: NEGATIVE
Protein, ur: NEGATIVE mg/dL
Specific Gravity, Urine: 1.028 (ref 1.005–1.030)
pH: 5 (ref 5.0–8.0)

## 2023-05-18 LAB — BASIC METABOLIC PANEL WITH GFR
Anion gap: 7 (ref 5–15)
Anion gap: 9 (ref 5–15)
BUN: <5 mg/dL — ABNORMAL LOW (ref 6–20)
BUN: <5 mg/dL — ABNORMAL LOW (ref 6–20)
CO2: 24 mmol/L (ref 22–32)
CO2: 24 mmol/L (ref 22–32)
Calcium: 8.5 mg/dL — ABNORMAL LOW (ref 8.9–10.3)
Calcium: 8.3 mg/dL — ABNORMAL LOW (ref 8.9–10.3)
Chloride: 94 mmol/L — ABNORMAL LOW (ref 98–111)
Chloride: 93 mmol/L — ABNORMAL LOW (ref 98–111)
Creatinine, Ser: 0.55 mg/dL (ref 0.44–1.00)
Creatinine, Ser: 0.66 mg/dL (ref 0.44–1.00)
GFR, Estimated: >60 mL/min (ref >60)
GFR, Estimated: >60 mL/min (ref >60)
Glucose, Bld: 109 mg/dL — ABNORMAL HIGH (ref 70–99)
Glucose, Bld: 94 mg/dL (ref 70–99)
Potassium: 3.6 mmol/L (ref 3.5–5.1)
Potassium: 3.5 mmol/L (ref 3.5–5.1)
Sodium: 125 mmol/L — ABNORMAL LOW (ref 135–145)
Sodium: 126 mmol/L — ABNORMAL LOW (ref 135–145)

## 2023-05-18 LAB — COMPREHENSIVE METABOLIC PANEL WITH GFR
ALT: 19 U/L (ref 0–44)
AST: 20 U/L (ref 15–41)
Albumin: 3.4 g/dL — ABNORMAL LOW (ref 3.5–5.0)
Alkaline Phosphatase: 76 U/L (ref 38–126)
Anion gap: 9 (ref 5–15)
BUN: <5 mg/dL — ABNORMAL LOW (ref 6–20)
CO2: 23 mmol/L (ref 22–32)
Calcium: 8.3 mg/dL — ABNORMAL LOW (ref 8.9–10.3)
Chloride: 88 mmol/L — ABNORMAL LOW (ref 98–111)
Creatinine, Ser: 0.69 mg/dL (ref 0.44–1.00)
GFR, Estimated: >60 mL/min (ref >60)
Glucose, Bld: 109 mg/dL — ABNORMAL HIGH (ref 70–99)
Potassium: 3.8 mmol/L (ref 3.5–5.1)
Sodium: 120 mmol/L — ABNORMAL LOW (ref 135–145)
Total Bilirubin: 2 mg/dL — ABNORMAL HIGH (ref 0.0–1.2)
Total Protein: 5.5 g/dL — ABNORMAL LOW (ref 6.5–8.1)

## 2023-05-18 LAB — TSH: TSH: 0.056 u[IU]/mL — ABNORMAL LOW (ref 0.350–4.500)

## 2023-05-18 LAB — LIPASE, BLOOD: Lipase: 27 U/L (ref 11–51)

## 2023-05-18 LAB — OSMOLALITY, URINE: Osmolality, Ur: 195 mosm/kg — ABNORMAL LOW (ref 300–900)

## 2023-05-18 LAB — HIV ANTIBODY (ROUTINE TESTING W REFLEX): HIV Screen 4th Generation wRfx: NONREACTIVE

## 2023-05-18 LAB — HCG, SERUM, QUALITATIVE: Preg, Serum: NEGATIVE

## 2023-05-18 LAB — OSMOLALITY: Osmolality: 245 mosm/kg — CL (ref 275–295)

## 2023-05-18 LAB — MAGNESIUM: Magnesium: 1.3 mg/dL — ABNORMAL LOW (ref 1.7–2.4)

## 2023-05-18 LAB — I-STAT CG4 LACTIC ACID, ED: Lactic Acid, Venous: 0.6 mmol/L (ref 0.5–1.9)

## 2023-05-18 LAB — SODIUM, URINE, RANDOM: Sodium, Ur: 32 mmol/L

## 2023-05-18 MED ORDER — FOSFOMYCIN TROMETHAMINE 3 G PO PACK
3.0000 g | PACK | Freq: Once | ORAL | Status: AC
  Administered 2023-05-18: 3 g via ORAL
  Filled 2023-05-18 (×2): qty 3

## 2023-05-18 MED ORDER — ONDANSETRON HCL 4 MG PO TABS: 4.0000 mg | ORAL_TABLET | Freq: Four times a day (QID) | ORAL | Status: DC | PRN

## 2023-05-18 MED ORDER — FENTANYL CITRATE PF 50 MCG/ML IJ SOSY
50.0000 ug | PREFILLED_SYRINGE | Freq: Once | INTRAMUSCULAR | Status: AC
  Administered 2023-05-18: 50 ug via INTRAVENOUS
  Filled 2023-05-18: qty 1

## 2023-05-18 MED ORDER — ONDANSETRON HCL 4 MG/2ML IJ SOLN: 4.0000 mg | Freq: Four times a day (QID) | INTRAMUSCULAR | Status: DC | PRN

## 2023-05-18 MED ORDER — ACETAMINOPHEN 650 MG RE SUPP: 650.0000 mg | Freq: Four times a day (QID) | RECTAL | Status: DC | PRN

## 2023-05-18 MED ORDER — SODIUM CHLORIDE 0.9 % IV SOLN: INTRAVENOUS | Status: DC

## 2023-05-18 MED ORDER — SODIUM CHLORIDE 0.9% FLUSH
3.0000 mL | Freq: Two times a day (BID) | INTRAVENOUS | Status: DC
  Administered 2023-05-18 – 2023-05-19 (×2): 3 mL via INTRAVENOUS

## 2023-05-18 MED ORDER — METOCLOPRAMIDE HCL 5 MG/ML IJ SOLN
10.0000 mg | Freq: Once | INTRAMUSCULAR | Status: AC
  Administered 2023-05-18: 10 mg via INTRAVENOUS
  Filled 2023-05-18: qty 2

## 2023-05-18 MED ORDER — MAGNESIUM SULFATE 2 GM/50ML IV SOLN
2.0000 g | Freq: Once | INTRAVENOUS | Status: AC
  Administered 2023-05-18: 2 g via INTRAVENOUS
  Filled 2023-05-18: qty 50

## 2023-05-18 MED ORDER — PANTOPRAZOLE SODIUM 40 MG PO TBEC
40.0000 mg | DELAYED_RELEASE_TABLET | Freq: Every day | ORAL | Status: DC
  Administered 2023-05-18 – 2023-05-19 (×2): 40 mg via ORAL
  Filled 2023-05-18 (×2): qty 1

## 2023-05-18 MED ORDER — ACETAMINOPHEN 325 MG PO TABS
650.0000 mg | ORAL_TABLET | Freq: Four times a day (QID) | ORAL | Status: DC | PRN
  Administered 2023-05-19: 650 mg via ORAL
  Filled 2023-05-18: qty 2

## 2023-05-18 MED ORDER — CALCIUM GLUCONATE-NACL 2-0.675 GM/100ML-% IV SOLN
2.0000 g | Freq: Once | INTRAVENOUS | Status: AC
  Administered 2023-05-18: 2000 mg via INTRAVENOUS
  Filled 2023-05-18: qty 100

## 2023-05-18 MED ORDER — ENOXAPARIN SODIUM 40 MG/0.4ML IJ SOSY
40.0000 mg | PREFILLED_SYRINGE | INTRAMUSCULAR | Status: DC
  Administered 2023-05-18 – 2023-05-19 (×2): 40 mg via SUBCUTANEOUS
  Filled 2023-05-18 (×2): qty 0.4

## 2023-05-18 MED ORDER — LACTATED RINGERS IV BOLUS
1000.0000 mL | Freq: Once | INTRAVENOUS | Status: AC
  Administered 2023-05-18: 1000 mL via INTRAVENOUS

## 2023-05-18 MED ORDER — ONDANSETRON HCL 4 MG/2ML IJ SOLN: 4.0000 mg | Freq: Once | INTRAMUSCULAR | Status: DC

## 2023-05-18 MED ORDER — LEVOTHYROXINE SODIUM 112 MCG PO TABS
112.0000 ug | ORAL_TABLET | Freq: Every day | ORAL | Status: DC
  Administered 2023-05-19: 112 ug via ORAL
  Filled 2023-05-18: qty 1

## 2023-05-18 NOTE — H&P (Addendum)
 History and Physical    Patient: Dana Harmon ZOX:[id number] DOB: 25-Dec-1970 DOA: 05/18/2023 DOS: the patient was seen and examined on 05/18/2023 PCP: Jonetta Nest, MD  Patient coming from: Home  Chief Complaint:  Chief Complaint  Patient presents with   Nausea   HPI: Dana Harmon is a 53 y.o. female with medical history significant of hypothyroidism and anxiety and depression who presents with abdominal pain and nausea.  She has been experiencing stomach upset and prolonged nausea for several weeks, with significant worsening on Sunday and Monday. Initially suspecting constipation, she took a laxative, which led to diarrhea and suspected dehydration, resulting in two to three bowel movements in one day.  The abdominal pain is primarily located in the left lower quadrant, sometimes extending to the suprapubic area. It is generally achy but can become sharp, without radiation to the back or shoulder blades. The pain is more noticeable during anxiety or digestion.  She had had an outpatient CT scan of his abdomen and pelvis yesterday that showed no acute abnormality. She reports increased urinary frequency but no discomfort or burning with urination.  She has a history of taking Zoloft, which she stopped for about six months, but recently restarted at a dose of 50 mg due to increased anxiety.  Over-the-phone her husband notes that the patient having having increased social stressors.  She is unsure if her sodium levels have been low in the past. No pain radiating to the back or shoulder blades.   In the emergency department patient was noted to have stable vital signs.  Labs significant for sodium 120, chloride 88, magnesium 1.3, total bilirubin 2, and lactic acid 0.6.  Urinalysis noted small leukocytes, rare bacteria, and 6-10 WBCs.  Pelvic ultrasound was unremarkable.  Patient had been given 1 L of lactated Ringer's, fentany 50 mcg IV, l2 g of magnesium sulfate, and 10 mg of  Reglan.  Review of Systems: As mentioned in the history of present illness. All other systems reviewed and are negative. Past Medical History:  Diagnosis Date   Thyroid  disease    Past Surgical History:  Procedure Laterality Date   ANTERIOR CRUCIATE LIGAMENT REPAIR     UCL     Social History:  reports that she has never smoked. She has never used smokeless tobacco. She reports that she does not currently use alcohol. She reports that she does not currently use drugs.  No Known Allergies  Family History  Problem Relation Age of Onset   Breast cancer Neg Hx     Prior to Admission medications   Medication Sig Start Date End Date Taking? Authorizing Provider  buPROPion (WELLBUTRIN XL) 150 MG 24 hr tablet Take 150 mg by mouth daily.    [provider]  levothyroxine (SYNTHROID) 150 MCG tablet Take 150 mcg by mouth daily before breakfast.    [provider]  sertraline (ZOLOFT) 50 MG tablet Take 50 mg by mouth daily.    [provider]  UNKNOWN TO PATIENT     [provider]    Physical Exam: Vitals:   05/18/23 0427 05/18/23 0428  BP: 121/71   Pulse: 88   Resp: 16   Temp: 97.7 F (36.5 C)   TempSrc: Oral   SpO2: 100%   Weight:  56.7 kg  Height:  5\' 7"  (1.702 m)   Constitutional: Middle-aged female currently in no acute distress Eyes: PERRL, lids and conjunctivae normal ENMT: Mucous membranes are moist. Normal dentition.  Neck: normal,  supple Respiratory: clear to auscultation bilaterally, no wheezing, no crackles. Normal respiratory effort. No accessory muscle use.  Cardiovascular: Regular rate and rhythm, no murmurs / rubs / gallops. No extremity edema. 2+ pedal pulses. No carotid bruits.  Abdomen: Tenderness to palpation suprapubically appreciated.  Bowel sounds present in all 4 quadrants. Musculoskeletal: no clubbing / cyanosis. No joint deformity upper and lower extremities. Good ROM, no contractures. Normal muscle tone.  Skin: no  rashes, lesions, ulcers. No induration Neurologic: CN 2-12 grossly intact. Strength 5/5 in all 4.  Psychiatric: Normal judgment and insight. Alert and oriented x 3. Normal mood.   Data Reviewed:  Reviewed labs, imaging, and pertinent records as documented.  Assessment and Plan:  Hyponatremia Acute.  Sodium noted to be 120.  Patient has been given 1 L of lactated Ringer's.  Unclear if symptoms are secondary to decreased p.o. intake at this time with reports of continued nausea. - Check osmolarity, urine sodium, and urine osmolality - Check sodium q. 6 hours x 3 - Goal correction no more than 10 mmol/L in a 24-hour period  - Adjust IV fluids as needed to goal  Abnormal urinalysis Possible urinary tract infection Acute.  Patient with suprapubic tenderness present on physical exam and reports of possible increased urinary frequency..  Urinalysis noted small leukocytes, rare bacteria, and 6-10 WBCs.  Patient had recent CT scan of her abdomen and pelvis as well as transvaginal ultrasound that showed no acute abnormalities. - Check urine culture - Fosfomycin  Hypomagnesemia Acute.  Magnesium noted to be 1.3.  Patient was given 2 g of magnesium sulfate IV. - Continue to monitor and replace as needed  Hypothyroidism Patient reported dose of levothyroxine had been decreased back to 112 mcg - Check TSH - Continue levothyroxine.  Will need to verify last dose adjustment  Anxiety Patient reports recently resuming Zoloft after being off medication for some time. - Held Zoloft   DVT prophylaxis: Lovenox  Advance Care Planning:   Code Status: Full Code    Consults: None  Family Communication: Husband updated over the phone  Severity of Illness: The appropriate patient status for this patient is OBSERVATION. Observation status is judged to be reasonable and necessary in order to provide the required intensity of service to ensure the patient's safety. The patient's presenting symptoms,  physical exam findings, and initial radiographic and laboratory data in the context of their medical condition is felt to place them at decreased risk for further clinical deterioration. Furthermore, it is anticipated that the patient will be medically stable for discharge from the hospital within 2 midnights of admission.   Author: Lena Qualia, MD 05/18/2023 7:19 AM  For on call review [email or web address].

## 2023-05-18 NOTE — Plan of Care (Signed)

## 2023-05-18 NOTE — ED Triage Notes (Signed)
 Pt BIB EMS from home, reporting N/V, constipation and one episode of diarrhea. Pt states that she was seen at [facility] today w/ a negative CT, and sent home w/ PRN zofran. Pt reports that she still feels constipated, although the scan was negative for constipation. Pt took 2 Zofran prior to calling EMS.

## 2023-05-18 NOTE — ED Provider Notes (Signed)
 [facility] Provider Note   CSN: [id number] Arrival date & time: 05/18/23  0424     History  Chief Complaint  Patient presents with   Nausea    Dana WILLETS is a 53 y.o. female.  HPI Patient presents for abdominal pain.  Over the past several weeks, she has had persistent nausea.  This has been worsening and she has not been able to eat for the past several days.  She thought he was constipated and did take a senna, suppository and enema at home.  She did have a bowel movement yesterday.  She has since developed a left lower quadrant abdominal pain.  Her PCP scheduled her for an outpatient CT scan yesterday.  This did not show any acute findings.  Today, patient has worsening pain.  She has been taking ibuprofen and Tylenol at home.  Current pain is 8/10 in severity.    Home Medications Prior to Admission medications   Medication Sig Start Date End Date Taking? Authorizing Provider  buPROPion (WELLBUTRIN XL) 150 MG 24 hr tablet Take 150 mg by mouth daily.    [provider]  levothyroxine (SYNTHROID) 150 MCG tablet Take 150 mcg by mouth daily before breakfast.    [provider]  sertraline (ZOLOFT) 50 MG tablet Take 50 mg by mouth daily.    [provider]  UNKNOWN TO PATIENT     [provider]      Allergies    Patient has no known allergies.    Review of Systems   Review of Systems  Gastrointestinal:  Positive for abdominal pain, constipation, nausea and vomiting.  All other systems reviewed and are negative.   Physical Exam Updated Vital Signs BP 121/71   Pulse 88   Temp 97.7 F (36.5 C) (Oral)   Resp 16   Ht 5\' 7"  (1.702 m)   Wt 56.7 kg   SpO2 100%   BMI 19.58 kg/m  Physical Exam Vitals and nursing note reviewed.  Constitutional:      General: She is not in acute distress.    Appearance: Normal appearance. She is well-developed. She is not ill-appearing, toxic-appearing or  diaphoretic.  HENT:     Head: Normocephalic and atraumatic.     Right Ear: External ear normal.     Left Ear: External ear normal.     Nose: Nose normal.     Mouth/Throat:     Mouth: Mucous membranes are moist.  Eyes:     Extraocular Movements: Extraocular movements intact.     Conjunctiva/sclera: Conjunctivae normal.  Cardiovascular:     Rate and Rhythm: Normal rate and regular rhythm.  Pulmonary:     Effort: Pulmonary effort is normal. No respiratory distress.  Abdominal:     Palpations: Abdomen is soft.     Tenderness: There is abdominal tenderness. There is no guarding or rebound.  Musculoskeletal:        General: No swelling. Normal range of motion.     Cervical back: Normal range of motion and neck supple.  Skin:    General: Skin is warm and dry.     Coloration: Skin is not jaundiced or pale.  Neurological:     General: No focal deficit present.     Mental Status: She is alert and oriented to person, place, and time.  Psychiatric:        Mood and Affect: Mood normal.        Behavior: Behavior  normal.     ED Results / Procedures / Treatments   Labs (all labs ordered are listed, but only abnormal results are displayed) Labs Reviewed  COMPREHENSIVE METABOLIC PANEL WITH GFR - Abnormal; Notable for the following components:      Result Value   Sodium 120 (*)    Chloride 88 (*)    Glucose, Bld 109 (*)    BUN <5 (*)    Calcium 8.3 (*)    Total Protein 5.5 (*)    Albumin 3.4 (*)    Total Bilirubin 2.0 (*)    All other components within normal limits  CBC WITH DIFFERENTIAL/PLATELET - Abnormal; Notable for the following components:   HCT 34.0 (*)    MCHC 36.5 (*)    RDW 11.2 (*)    All other components within normal limits  URINALYSIS, ROUTINE W REFLEX MICROSCOPIC - Abnormal; Notable for the following components:   APPearance HAZY (*)    Leukocytes,Ua SMALL (*)    Bacteria, UA RARE (*)    All other components within normal limits  MAGNESIUM - Abnormal; Notable  for the following components:   Magnesium 1.3 (*)    All other components within normal limits  LIPASE, BLOOD  HCG, SERUM, QUALITATIVE  I-STAT CG4 LACTIC ACID, ED    EKG None  Radiology US  Pelvis Complete Result Date: 05/18/2023 CLINICAL DATA:  Pelvic pain with constipation and nausea/vomiting. EXAM: TRANSABDOMINAL AND TRANSVAGINAL ULTRASOUND OF PELVIS DOPPLER ULTRASOUND OF OVARIES TECHNIQUE: Both transabdominal and transvaginal ultrasound examinations of the pelvis were performed. Transabdominal technique was performed for global imaging of the pelvis including uterus, ovaries, adnexal regions, and pelvic cul-de-sac. It was necessary to proceed with endovaginal exam following the transabdominal exam to visualize the ovaries. Color and duplex Doppler ultrasound was utilized to evaluate blood flow to the ovaries. COMPARISON:  CT scan 05/17/2023.  Pelvic ultrasound 09/14/2022. FINDINGS: Uterus Measurements: 4.6 x 3.0 x 4.0 cm = volume: 29 mL. 2 mm echogenic focus in the posterior myometrium may be nonspecific calcification. Uterus otherwise unremarkable. Endometrium Thickness: 1-2 mm.  No focal abnormality visualized. Right ovary Measurements: 1.0 x 1.4 x 0.8 cm = volume: 0.6 mL. Normal appearance/no adnexal mass. Left ovary Measurements: 1.9 x 1.1 x 1.4 cm = volume: 1.6 mL. Normal appearance/no adnexal mass. Pulsed Doppler evaluation of both ovaries demonstrates normal low-resistance arterial and venous waveforms. Other findings No abnormal free fluid. IMPRESSION: Unremarkable pelvic ultrasound. No findings to explain the patient's history of pain. Electronically Signed   By: Donnal Fusi M.D.   On: 05/18/2023 06:08   US  Transvaginal Non-OB Result Date: 05/18/2023 CLINICAL DATA:  Pelvic pain with constipation and nausea/vomiting. EXAM: TRANSABDOMINAL AND TRANSVAGINAL ULTRASOUND OF PELVIS DOPPLER ULTRASOUND OF OVARIES TECHNIQUE: Both transabdominal and transvaginal ultrasound examinations of the  pelvis were performed. Transabdominal technique was performed for global imaging of the pelvis including uterus, ovaries, adnexal regions, and pelvic cul-de-sac. It was necessary to proceed with endovaginal exam following the transabdominal exam to visualize the ovaries. Color and duplex Doppler ultrasound was utilized to evaluate blood flow to the ovaries. COMPARISON:  CT scan 05/17/2023.  Pelvic ultrasound 09/14/2022. FINDINGS: Uterus Measurements: 4.6 x 3.0 x 4.0 cm = volume: 29 mL. 2 mm echogenic focus in the posterior myometrium may be nonspecific calcification. Uterus otherwise unremarkable. Endometrium Thickness: 1-2 mm.  No focal abnormality visualized. Right ovary Measurements: 1.0 x 1.4 x 0.8 cm = volume: 0.6 mL. Normal appearance/no adnexal mass. Left ovary Measurements: 1.9 x 1.1 x 1.4 cm =  volume: 1.6 mL. Normal appearance/no adnexal mass. Pulsed Doppler evaluation of both ovaries demonstrates normal low-resistance arterial and venous waveforms. Other findings No abnormal free fluid. IMPRESSION: Unremarkable pelvic ultrasound. No findings to explain the patient's history of pain. Electronically Signed   By: Donnal Fusi M.D.   On: 05/18/2023 06:08   US  Art/Ven Flow Abd Pelv Doppler Result Date: 05/18/2023 CLINICAL DATA:  Pelvic pain with constipation and nausea/vomiting. EXAM: TRANSABDOMINAL AND TRANSVAGINAL ULTRASOUND OF PELVIS DOPPLER ULTRASOUND OF OVARIES TECHNIQUE: Both transabdominal and transvaginal ultrasound examinations of the pelvis were performed. Transabdominal technique was performed for global imaging of the pelvis including uterus, ovaries, adnexal regions, and pelvic cul-de-sac. It was necessary to proceed with endovaginal exam following the transabdominal exam to visualize the ovaries. Color and duplex Doppler ultrasound was utilized to evaluate blood flow to the ovaries. COMPARISON:  CT scan 05/17/2023.  Pelvic ultrasound 09/14/2022. FINDINGS: Uterus Measurements: 4.6 x 3.0 x 4.0  cm = volume: 29 mL. 2 mm echogenic focus in the posterior myometrium may be nonspecific calcification. Uterus otherwise unremarkable. Endometrium Thickness: 1-2 mm.  No focal abnormality visualized. Right ovary Measurements: 1.0 x 1.4 x 0.8 cm = volume: 0.6 mL. Normal appearance/no adnexal mass. Left ovary Measurements: 1.9 x 1.1 x 1.4 cm = volume: 1.6 mL. Normal appearance/no adnexal mass. Pulsed Doppler evaluation of both ovaries demonstrates normal low-resistance arterial and venous waveforms. Other findings No abnormal free fluid. IMPRESSION: Unremarkable pelvic ultrasound. No findings to explain the patient's history of pain. Electronically Signed   By: Donnal Fusi M.D.   On: 05/18/2023 06:08   CT ABDOMEN PELVIS W CONTRAST Result Date: 05/17/2023 CLINICAL DATA:  Left lower quadrant pain. EXAM: CT ABDOMEN AND PELVIS WITH CONTRAST TECHNIQUE: Multidetector CT imaging of the abdomen and pelvis was performed using the standard protocol following bolus administration of intravenous contrast. RADIATION DOSE REDUCTION: This exam was performed according to the departmental dose-optimization program which includes automated exposure control, adjustment of the mA and/or kV according to patient size and/or use of iterative reconstruction technique. CONTRAST:  80mL OMNIPAQUE IOHEXOL 300 MG/ML  SOLN COMPARISON:  None Available. FINDINGS: Lower chest: No acute abnormality. Hepatobiliary: No focal liver abnormality is seen. No gallstones, gallbladder wall thickening, or biliary dilatation. Pancreas: Unremarkable. No pancreatic ductal dilatation or surrounding inflammatory changes. Spleen: Normal in size without focal abnormality. Adrenals/Urinary Tract: Adrenal glands are unremarkable. Kidneys are normal, without renal calculi, focal lesion, or hydronephrosis. A prominent extrarenal pelvis is seen on the left. Bladder is unremarkable. Stomach/Bowel: Stomach is within normal limits. Appendix appears normal. No evidence of  bowel wall thickening, distention, or inflammatory changes. Vascular/Lymphatic: No significant vascular findings are present. No enlarged abdominal or pelvic lymph nodes. Reproductive: Uterus and bilateral adnexa are unremarkable. Other: No abdominal wall hernia or abnormality. No abdominopelvic ascites. Musculoskeletal: No acute or significant osseous findings. IMPRESSION: No acute or active process within the abdomen or pelvis. Electronically Signed   By: Virgle Grime M.D.   On: 05/17/2023 18:01    Procedures Procedures    Medications Ordered in ED Medications  magnesium sulfate IVPB 2 g 50 mL (has no administration in time range)  lactated ringers bolus 1,000 mL (1,000 mLs Intravenous New Bag/Given 05/18/23 0601)  fentaNYL (SUBLIMAZE) injection 50 mcg (50 mcg Intravenous Given 05/18/23 0605)  metoCLOPramide (REGLAN) injection 10 mg (10 mg Intravenous Given 05/18/23 0603)    ED Course/ Medical Decision Making/ A&P  Medical Decision Making Amount and/or Complexity of Data Reviewed Labs: ordered. Radiology: ordered.  Risk Prescription drug management.   This patient presents to the ED for concern of abdominal pain, nausea, vomiting, this involves an extensive number of treatment options, and is a complaint that carries with it a high risk of complications and morbidity.  The differential diagnosis includes enteritis, colitis, diverticulitis, PID, ovarian torsion, metabolic derangements   Co morbidities that complicate the patient evaluation  Hypothyroidism   Additional history obtained:  Additional history obtained from N/A External records from outside source obtained and reviewed including EMR   Lab Tests:  I Ordered, and personally interpreted labs.  The pertinent results include: Hyponatremia and hypomagnesemia are present.  Lab work is otherwise unremarkable.   Imaging Studies ordered:  I ordered imaging studies including pelvic  ultrasound I independently visualized and interpreted imaging which showed no acute findings I agree with the radiologist interpretation   Cardiac Monitoring: / EKG:  The patient was maintained on a cardiac monitor.  I personally viewed and interpreted the cardiac monitored which showed an underlying rhythm of: Sinus rhythm   Problem List / ED Course / Critical interventions / Medication management  Patient presents for ongoing nausea, worsening over the past several days.  She also developed a left lower quadrant abdominal pain following suppository and enema yesterday.  She underwent an outpatient CT scan.  This was done after she developed the lower abdominal pain.  I reviewed this in chart and scan and not show any acute findings.  On arrival in the ED, her vital signs are normal.  She is well-appearing on exam.  She has some mild lower abdominal tenderness.  Pelvic ultrasound was ordered.  Laboratory workup was initiated.  Patient was ordered IV fluids, fentanyl, and Reglan.  On reassessment, patient's symptoms are improved.  Her lab work is notable for hyponatremia and hypomagnesemia.  Hyponatremia likely attributable to poor p.o. intake.  She does not drink alcohol.  Currently her medications are Synthroid and oral replacement.  Hyponatremia also likely cause of her ongoing and worsening nausea.  Replacement magnesium was ordered.  Patient to be admitted for further management. I ordered medication including IV fluids for hydration; fentanyl for analgesia; Reglan for nausea; magnesium sulfate for hypomagnesemia Reevaluation of the patient after these medicines showed that the patient improved I have reviewed the patients home medicines and have made adjustments as needed   Social Determinants of Health:  Lives independently         Final Clinical Impression(s) / ED Diagnoses Final diagnoses:  Hyponatremia  Hypomagnesemia    Rx / DC Orders ED Discharge Orders     None          Iva Mariner, MD 05/18/23 [phone]

## 2023-05-18 NOTE — ED Notes (Signed)
 Pt is leaving with transport, in no new onset distress.

## 2023-05-18 NOTE — ED Notes (Signed)
 Assuming care, no report given.

## 2023-05-19 ENCOUNTER — Encounter (HOSPITAL_COMMUNITY): Payer: Self-pay | Admitting: Internal Medicine

## 2023-05-19 DIAGNOSIS — E871 Hypo-osmolality and hyponatremia: Secondary | ICD-10-CM | POA: Diagnosis not present

## 2023-05-19 LAB — CBC
HCT: 35.6 % — ABNORMAL LOW (ref 36.0–46.0)
Hemoglobin: 12.3 g/dL (ref 12.0–15.0)
MCH: 30.1 pg (ref 26.0–34.0)
MCHC: 34.6 g/dL (ref 30.0–36.0)
MCV: 87.3 fL (ref 80.0–100.0)
Platelets: 204 10*3/uL (ref 150–400)
RBC: 4.08 MIL/uL (ref 3.87–5.11)
RDW: 11.6 % (ref 11.5–15.5)
WBC: 4.5 10*3/uL (ref 4.0–10.5)
nRBC: 0 % (ref 0.0–0.2)

## 2023-05-19 LAB — COMPREHENSIVE METABOLIC PANEL WITH GFR
ALT: 19 U/L (ref 0–44)
AST: 21 U/L (ref 15–41)
Albumin: 3.3 g/dL — ABNORMAL LOW (ref 3.5–5.0)
Alkaline Phosphatase: 77 U/L (ref 38–126)
Anion gap: 6 (ref 5–15)
BUN: <5 mg/dL — ABNORMAL LOW (ref 6–20)
CO2: 24 mmol/L (ref 22–32)
Calcium: 8.7 mg/dL — ABNORMAL LOW (ref 8.9–10.3)
Chloride: 98 mmol/L (ref 98–111)
Creatinine, Ser: 0.63 mg/dL (ref 0.44–1.00)
GFR, Estimated: >60 mL/min (ref >60)
Glucose, Bld: 96 mg/dL (ref 70–99)
Potassium: 4.4 mmol/L (ref 3.5–5.1)
Sodium: 128 mmol/L — ABNORMAL LOW (ref 135–145)
Total Bilirubin: 2 mg/dL — ABNORMAL HIGH (ref 0.0–1.2)
Total Protein: 5.5 g/dL — ABNORMAL LOW (ref 6.5–8.1)

## 2023-05-19 LAB — URINE CULTURE: Culture: NO GROWTH

## 2023-05-19 LAB — SODIUM
Sodium: 129 mmol/L — ABNORMAL LOW (ref 135–145)
Sodium: 133 mmol/L — ABNORMAL LOW (ref 135–145)

## 2023-05-19 LAB — C DIFFICILE QUICK SCREEN W PCR REFLEX
C Diff antigen: NEGATIVE
C Diff interpretation: NOT DETECTED
C Diff toxin: NEGATIVE

## 2023-05-19 LAB — MAGNESIUM: Magnesium: 1.8 mg/dL (ref 1.7–2.4)

## 2023-05-19 LAB — SARS CORONAVIRUS 2 BY RT PCR: SARS Coronavirus 2 by RT PCR: NEGATIVE

## 2023-05-19 NOTE — Discharge Summary (Signed)
 Physician Discharge Summary   Patient: Dana Harmon MRN: [id number] DOB: 1970/05/22  Admit date:     05/18/2023  Discharge date: 05/19/23  Discharge Physician: Cherylle Corwin   PCP: Elester Grim, MD   Recommendations at discharge:    Follow up with PCP in 1-2 weeks Recommend recheck BMET in one week, focus on sodium  Discharge Diagnoses: Principal Problem:   Hyponatremia Active Problems:   Abnormal urinalysis   Hypomagnesemia   Hypothyroidism  Resolved Problems:   * No resolved hospital problems. *  Hospital Course: 53 y.o. female with medical history significant of hypothyroidism and anxiety and depression who presents with abdominal pain and nausea.   She has been experiencing stomach upset and prolonged nausea for several weeks, with significant worsening on Sunday and Monday. Initially suspecting constipation, she took a laxative, which led to diarrhea and suspected dehydration, resulting in two to three bowel movements in one day.   The abdominal pain is primarily located in the left lower quadrant, sometimes extending to the suprapubic area. It is generally achy but can become sharp, without radiation to the back or shoulder blades. The pain is more noticeable during anxiety or digestion.   She had had an outpatient CT scan of his abdomen and pelvis yesterday that showed no acute abnormality. She reports increased urinary frequency but no discomfort or burning with urination.   She has a history of taking Zoloft, which she stopped for about six months, but recently restarted at a dose of 50 mg due to increased anxiety.  Over-the-phone her husband notes that the patient having having increased social stressors.  She is unsure if her sodium levels have been low in the past. No pain radiating to the back or shoulder blades.     In the emergency department patient was noted to have stable vital signs.  Labs significant for sodium 120, chloride 88, magnesium 1.3, total  bilirubin 2, and lactic acid 0.6.  Urinalysis noted small leukocytes, rare bacteria, and 6-10 WBCs.  Pelvic ultrasound was unremarkable.  Patient had been given 1 L of lactated Ringer's, fentany 50 mcg IV, l2 g of magnesium sulfate, and 10 mg of Reglan  Assessment and Plan: Hyponatremia secondary to SIADH Acute.  Sodium noted to be 120.  Serum and urine osm low, urine Na elevated. Suspect SIADH Sodium improved gradually this course with gentle IVF with 1200cc fluid restriction, up to 133 on d/c Recommend pt continues to hold off on Zoloft moving forward given risk of SIADH Recommend recheck Na in 1 week   Abnormal urinalysis Possible urinary tract infection -UA did appear abnormal, with urine cx being found to be neg - reportedly was given dose of Fosfomycin  Diarrhea likely secondary to gastroenteritis -Pt presented with significant bouts of watery diarrhea shortly after eating local [place] food. No known sick contacts -Continued supportive care this visit. -Diarrhea resolved by time of d/c  -Cdiff was neg. Covid neg   Hypomagnesemia replaced   Hypothyroidism Patient reported dose of levothyroxine had been decreased back to 112 mcg, however on MAR, pt was last dispensed Levothyroxine 125mcg on 03/01/23 by PCP - TSH 0.056 - Continued levothyroxine.   - Recommend close f/u with PCP and cont to adjust levothyroxine as needed   Anxiety Patient reports recently resuming Zoloft after being off medication for some time. - recommend continuing to avoid Zoloft given risk of SIADH        Consultants:  Procedures performed:   Disposition: Home Diet recommendation:  Regular diet DISCHARGE MEDICATION: Allergies as of 05/19/2023       Reactions   Codeine Nausea And Vomiting   Flexeril [cyclobenzaprine] Nausea And Vomiting        Medication List     STOP taking these medications    sertraline 50 MG tablet Commonly known as: ZOLOFT       TAKE these medications     Bijuva 0.5-100 MG Caps Generic drug: Estradiol-Progesterone Take 1 capsule by mouth See admin instructions. 1 capsule oral every 90 days.   levothyroxine 112 MCG tablet Commonly known as: SYNTHROID Take 112 mcg by mouth daily before breakfast.   ondansetron 4 MG disintegrating tablet Commonly known as: ZOFRAN-ODT Take 4 mg by mouth every 8 (eight) hours as needed for vomiting or nausea.        Follow-up Information     Pahwani, Rinka R, MD Follow up in 2 week(s).   Specialty: Internal Medicine Why: Hospital follow up Contact information: [address] [place] [address] [phone]                Discharge Exam: [name] Weights   05/18/23 0428  Weight: 56.7 kg   General exam: Awake, laying in bed, in nad Respiratory system: Normal respiratory effort, no wheezing Cardiovascular system: regular rate, s1, s2 Gastrointestinal system: Soft, nondistended, positive BS Central nervous system: CN2-12 grossly intact, strength intact Extremities: Perfused, no clubbing Skin: Normal skin turgor, no notable skin lesions seen Psychiatry: Mood normal // no visual hallucinations   Condition at discharge: fair  The results of significant diagnostics from this hospitalization (including imaging, microbiology, ancillary and laboratory) are listed below for reference.   Imaging Studies: US  Pelvis Complete Result Date: 05/18/2023 CLINICAL DATA:  Pelvic pain with constipation and nausea/vomiting. EXAM: TRANSABDOMINAL AND TRANSVAGINAL ULTRASOUND OF PELVIS DOPPLER ULTRASOUND OF OVARIES TECHNIQUE: Both transabdominal and transvaginal ultrasound examinations of the pelvis were performed. Transabdominal technique was performed for global imaging of the pelvis including uterus, ovaries, adnexal regions, and pelvic cul-de-sac. It was necessary to proceed with endovaginal exam following the transabdominal exam to visualize the ovaries. Color and duplex Doppler ultrasound  was utilized to evaluate blood flow to the ovaries. COMPARISON:  CT scan 05/17/2023.  Pelvic ultrasound 09/14/2022. FINDINGS: Uterus Measurements: 4.6 x 3.0 x 4.0 cm = volume: 29 mL. 2 mm echogenic focus in the posterior myometrium may be nonspecific calcification. Uterus otherwise unremarkable. Endometrium Thickness: 1-2 mm.  No focal abnormality visualized. Right ovary Measurements: 1.0 x 1.4 x 0.8 cm = volume: 0.6 mL. Normal appearance/no adnexal mass. Left ovary Measurements: 1.9 x 1.1 x 1.4 cm = volume: 1.6 mL. Normal appearance/no adnexal mass. Pulsed Doppler evaluation of both ovaries demonstrates normal low-resistance arterial and venous waveforms. Other findings No abnormal free fluid. IMPRESSION: Unremarkable pelvic ultrasound. No findings to explain the patient's history of pain. Electronically Signed   By: Donnal Fusi M.D.   On: 05/18/2023 06:08   US  Transvaginal Non-OB Result Date: 05/18/2023 CLINICAL DATA:  Pelvic pain with constipation and nausea/vomiting. EXAM: TRANSABDOMINAL AND TRANSVAGINAL ULTRASOUND OF PELVIS DOPPLER ULTRASOUND OF OVARIES TECHNIQUE: Both transabdominal and transvaginal ultrasound examinations of the pelvis were performed. Transabdominal technique was performed for global imaging of the pelvis including uterus, ovaries, adnexal regions, and pelvic cul-de-sac. It was necessary to proceed with endovaginal exam following the transabdominal exam to visualize the ovaries. Color and duplex Doppler ultrasound was utilized to evaluate blood flow to the ovaries. COMPARISON:  CT scan 05/17/2023.  Pelvic ultrasound  09/14/2022. FINDINGS: Uterus Measurements: 4.6 x 3.0 x 4.0 cm = volume: 29 mL. 2 mm echogenic focus in the posterior myometrium may be nonspecific calcification. Uterus otherwise unremarkable. Endometrium Thickness: 1-2 mm.  No focal abnormality visualized. Right ovary Measurements: 1.0 x 1.4 x 0.8 cm = volume: 0.6 mL. Normal appearance/no adnexal mass. Left ovary  Measurements: 1.9 x 1.1 x 1.4 cm = volume: 1.6 mL. Normal appearance/no adnexal mass. Pulsed Doppler evaluation of both ovaries demonstrates normal low-resistance arterial and venous waveforms. Other findings No abnormal free fluid. IMPRESSION: Unremarkable pelvic ultrasound. No findings to explain the patient's history of pain. Electronically Signed   By: Donnal Fusi M.D.   On: 05/18/2023 06:08   US  Art/Ven Flow Abd Pelv Doppler Result Date: 05/18/2023 CLINICAL DATA:  Pelvic pain with constipation and nausea/vomiting. EXAM: TRANSABDOMINAL AND TRANSVAGINAL ULTRASOUND OF PELVIS DOPPLER ULTRASOUND OF OVARIES TECHNIQUE: Both transabdominal and transvaginal ultrasound examinations of the pelvis were performed. Transabdominal technique was performed for global imaging of the pelvis including uterus, ovaries, adnexal regions, and pelvic cul-de-sac. It was necessary to proceed with endovaginal exam following the transabdominal exam to visualize the ovaries. Color and duplex Doppler ultrasound was utilized to evaluate blood flow to the ovaries. COMPARISON:  CT scan 05/17/2023.  Pelvic ultrasound 09/14/2022. FINDINGS: Uterus Measurements: 4.6 x 3.0 x 4.0 cm = volume: 29 mL. 2 mm echogenic focus in the posterior myometrium may be nonspecific calcification. Uterus otherwise unremarkable. Endometrium Thickness: 1-2 mm.  No focal abnormality visualized. Right ovary Measurements: 1.0 x 1.4 x 0.8 cm = volume: 0.6 mL. Normal appearance/no adnexal mass. Left ovary Measurements: 1.9 x 1.1 x 1.4 cm = volume: 1.6 mL. Normal appearance/no adnexal mass. Pulsed Doppler evaluation of both ovaries demonstrates normal low-resistance arterial and venous waveforms. Other findings No abnormal free fluid. IMPRESSION: Unremarkable pelvic ultrasound. No findings to explain the patient's history of pain. Electronically Signed   By: Donnal Fusi M.D.   On: 05/18/2023 06:08   CT ABDOMEN PELVIS W CONTRAST Result Date: 05/17/2023 CLINICAL  DATA:  Left lower quadrant pain. EXAM: CT ABDOMEN AND PELVIS WITH CONTRAST TECHNIQUE: Multidetector CT imaging of the abdomen and pelvis was performed using the standard protocol following bolus administration of intravenous contrast. RADIATION DOSE REDUCTION: This exam was performed according to the departmental dose-optimization program which includes automated exposure control, adjustment of the mA and/or kV according to patient size and/or use of iterative reconstruction technique. CONTRAST:  80mL OMNIPAQUE IOHEXOL 300 MG/ML  SOLN COMPARISON:  None Available. FINDINGS: Lower chest: No acute abnormality. Hepatobiliary: No focal liver abnormality is seen. No gallstones, gallbladder wall thickening, or biliary dilatation. Pancreas: Unremarkable. No pancreatic ductal dilatation or surrounding inflammatory changes. Spleen: Normal in size without focal abnormality. Adrenals/Urinary Tract: Adrenal glands are unremarkable. Kidneys are normal, without renal calculi, focal lesion, or hydronephrosis. A prominent extrarenal pelvis is seen on the left. Bladder is unremarkable. Stomach/Bowel: Stomach is within normal limits. Appendix appears normal. No evidence of bowel wall thickening, distention, or inflammatory changes. Vascular/Lymphatic: No significant vascular findings are present. No enlarged abdominal or pelvic lymph nodes. Reproductive: Uterus and bilateral adnexa are unremarkable. Other: No abdominal wall hernia or abnormality. No abdominopelvic ascites. Musculoskeletal: No acute or significant osseous findings. IMPRESSION: No acute or active process within the abdomen or pelvis. Electronically Signed   By: Virgle Grime M.D.   On: 05/17/2023 18:01    Microbiology: Results for orders placed or performed during the hospital encounter of 05/18/23  Urine Culture (for pregnant, neutropenic or  urologic patients or patients with an indwelling urinary catheter)     Status: None   Collection Time: 05/18/23  7:29  AM   Specimen: Urine, Clean Catch  Result Value Ref Range Status   Specimen Description URINE, CLEAN CATCH  Final   Special Requests NONE  Final   Culture   Final    NO GROWTH Performed at [facility], [address] [place], [place] [address]    Report Status 05/19/2023 FINAL  Final  C Difficile Quick Screen w PCR reflex     Status: None   Collection Time: 05/19/23  8:02 AM   Specimen: STOOL  Result Value Ref Range Status   C Diff antigen NEGATIVE NEGATIVE Final   C Diff toxin NEGATIVE NEGATIVE Final   C Diff interpretation No C. difficile detected.  Final    Comment: Performed at [facility], [address], [place], [place] [address]  SARS Coronavirus 2 by RT PCR (hospital order, performed in [facility] hospital lab) *cepheid single result test* Anterior Nasal Swab     Status: None   Collection Time: 05/19/23 10:30 AM   Specimen: Anterior Nasal Swab  Result Value Ref Range Status   SARS Coronavirus 2 by RT PCR NEGATIVE NEGATIVE Final    Comment: Performed at [facility], [address], [place], [place] [address]    Labs: CBC: Recent Labs  Lab 05/18/23 0521 05/19/23 0339  WBC 5.6 4.5  NEUTROABS 2.5  --   HGB 12.4 12.3  HCT 34.0* 35.6*  MCV 85.4 87.3  PLT 200 204   Basic Metabolic Panel: Recent Labs  Lab 05/18/23 0521 05/18/23 1314 05/18/23 1801 05/19/23 0339 05/19/23 1310 05/19/23 1525  NA 120* 125* 126* 128* 129* 133*  K 3.8 3.6 3.5 4.4  --   --   CL 88* 94* 93* 98  --   --   CO2 23 24 24 24   --   --   GLUCOSE 109* 109* 94 96  --   --   BUN <5* <5* <5* <5*  --   --   CREATININE 0.69 0.55 0.66 0.63  --   --   CALCIUM 8.3* 8.5* 8.3* 8.7*  --   --   MG 1.3*  --   --  1.8  --   --    Liver Function Tests: Recent Labs  Lab 05/18/23 0521 05/19/23 0339  AST 20 21  ALT 19 19  ALKPHOS 76 77  BILITOT 2.0* 2.0*  PROT 5.5* 5.5*  ALBUMIN 3.4* 3.3*   CBG: No results for input(s): "GLUCAP" in the last 168 hours.  Discharge time  spent: less than 30 minutes.  Signed: Cherylle Corwin, MD [facility] 05/19/2023

## 2023-05-19 NOTE — TOC Transition Note (Signed)
 Transition of Care [facility]) - Discharge Note   Patient Details  Name: Dana Harmon MRN: [id number] Date of Birth: 09-29-1970  Transition of Care [facility]) CM/SW Contact:  Tom-Johnson, Angelique Ken, RN Phone Number: 05/19/2023, 5:09 PM   Clinical Narrative:     Patient is scheduled for discharge today.  Hospital f/u and discharge instructions on AVS. No TOC needs or recommendations noted. Husband, John to transport at discharge.  No further TOC needs noted.       Final next level of care: Home/Self Care Barriers to Discharge: Barriers Resolved   Patient Goals and CMS Choice Patient states their goals for this hospitalization and ongoing recovery are:: To return home CMS Medicare.gov Compare Post Acute Care list provided to:: Patient Choice offered to / list presented to : NA      Discharge Placement                Patient to be transferred to facility by: Husband Name of family member notified: John    Discharge Plan and Services Additional resources added to the After Visit Summary for                  DME Arranged: N/A DME Agency: NA       HH Arranged: NA HH Agency: NA        Social Drivers of Health (SDOH) Interventions SDOH Screenings   Food Insecurity: No Food Insecurity (05/18/2023)  Housing: Low Risk  (05/18/2023)  Transportation Needs: No Transportation Needs (05/18/2023)  Utilities: Not At Risk (05/18/2023)  Social Connections: Socially Integrated (05/18/2023)  Tobacco Use: Low Risk  (07/27/2022)     Readmission Risk Interventions     No data to display

## 2023-05-19 NOTE — TOC CM/SW Note (Signed)
 Transition of Care [facility]) - Inpatient Brief Assessment   Patient Details  Name: Dana Harmon MRN: [id number] Date of Birth: [birth year]  Transition of Care [facility]) CM/SW Contact:    Tom-Johnson, Angelique Ken, RN Phone Number: 05/19/2023, 11:21 AM   Clinical Narrative:  Patient presented to the ED with Abdominal discomfort, N/V, Constipation and one episode of diarrhea. Patient was seen at   [facility] and CT Scan was done which showed no acute abnormality. Patient was sent home on Oral Zofran PRN and returned to the ED d/t still feeling Constipated. Patient states she took some Laxatives prior coming to the ED and now having frequent Stools. Stool sample sent to r/o C-diff. Admitted for further management.  From home with husband, has two children. Father and three siblings supportive. Currently employed and independent with her care. Does not have DME's at home. PCP is Pahwani, Regino Caprio, MD and uses [facility] on [address] in [place].   No TOC needs or recommendations noted at this time.  Patient not Medically ready for discharge.  CM will continue to follow as patient progresses with care towards discharge.         Transition of Care Asessment: Insurance and Status: Insurance coverage has been reviewed Patient has primary care physician: Yes Home environment has been reviewed: Yes Prior level of function:: Independent Prior/Current Home Services: No current home services Social Drivers of Health Review: SDOH reviewed no interventions necessary Readmission risk has been reviewed: Yes Transition of care needs: no transition of care needs at this time

## 2023-05-19 NOTE — Plan of Care (Signed)

## 2023-05-19 NOTE — Hospital Course (Signed)
 53 y.o. female with medical history significant of hypothyroidism and anxiety and depression who presents with abdominal pain and nausea.   She has been experiencing stomach upset and prolonged nausea for several weeks, with significant worsening on Sunday and Monday. Initially suspecting constipation, she took a laxative, which led to diarrhea and suspected dehydration, resulting in two to three bowel movements in one day.   The abdominal pain is primarily located in the left lower quadrant, sometimes extending to the suprapubic area. It is generally achy but can become sharp, without radiation to the back or shoulder blades. The pain is more noticeable during anxiety or digestion.   She had had an outpatient CT scan of his abdomen and pelvis yesterday that showed no acute abnormality. She reports increased urinary frequency but no discomfort or burning with urination.   She has a history of taking Zoloft, which she stopped for about six months, but recently restarted at a dose of 50 mg due to increased anxiety.  Over-the-phone her husband notes that the patient having having increased social stressors.  She is unsure if her sodium levels have been low in the past. No pain radiating to the back or shoulder blades.     In the emergency department patient was noted to have stable vital signs.  Labs significant for sodium 120, chloride 88, magnesium 1.3, total bilirubin 2, and lactic acid 0.6.  Urinalysis noted small leukocytes, rare bacteria, and 6-10 WBCs.  Pelvic ultrasound was unremarkable.  Patient had been given 1 L of lactated Ringer's, fentany 50 mcg IV, l2 g of magnesium sulfate, and 10 mg of Reglan

## 2023-05-19 NOTE — Progress Notes (Signed)
Pt discharged home with husband.

## 2023-05-23 LAB — STOOL CULTURE REFLEX - RSASHR

## 2023-05-23 LAB — STOOL CULTURE: E coli, Shiga toxin Assay: NEGATIVE

## 2023-05-23 LAB — STOOL CULTURE REFLEX - CMPCXR

## 2023-05-24 DIAGNOSIS — E039 Hypothyroidism, unspecified: Secondary | ICD-10-CM | POA: Diagnosis not present

## 2023-05-24 DIAGNOSIS — E222 Syndrome of inappropriate secretion of antidiuretic hormone: Secondary | ICD-10-CM | POA: Diagnosis not present

## 2023-05-24 DIAGNOSIS — E871 Hypo-osmolality and hyponatremia: Secondary | ICD-10-CM | POA: Diagnosis not present

## 2023-07-19 DIAGNOSIS — E039 Hypothyroidism, unspecified: Secondary | ICD-10-CM | POA: Diagnosis not present

## 2023-08-02 DIAGNOSIS — S43002A Unspecified subluxation of left shoulder joint, initial encounter: Secondary | ICD-10-CM | POA: Diagnosis not present

## 2023-08-22 DIAGNOSIS — M25512 Pain in left shoulder: Secondary | ICD-10-CM | POA: Diagnosis not present

## 2023-08-29 DIAGNOSIS — Z0000 Encounter for general adult medical examination without abnormal findings: Secondary | ICD-10-CM | POA: Diagnosis not present

## 2023-08-29 DIAGNOSIS — Z1231 Encounter for screening mammogram for malignant neoplasm of breast: Secondary | ICD-10-CM | POA: Diagnosis not present

## 2023-08-29 DIAGNOSIS — R634 Abnormal weight loss: Secondary | ICD-10-CM | POA: Diagnosis not present

## 2023-08-29 DIAGNOSIS — E039 Hypothyroidism, unspecified: Secondary | ICD-10-CM | POA: Diagnosis not present

## 2023-08-30 DIAGNOSIS — M25512 Pain in left shoulder: Secondary | ICD-10-CM | POA: Diagnosis not present

## 2023-08-30 DIAGNOSIS — S43002D Unspecified subluxation of left shoulder joint, subsequent encounter: Secondary | ICD-10-CM | POA: Diagnosis not present

## 2023-08-31 DIAGNOSIS — M25512 Pain in left shoulder: Secondary | ICD-10-CM | POA: Diagnosis not present

## 2023-09-02 DIAGNOSIS — H43811 Vitreous degeneration, right eye: Secondary | ICD-10-CM | POA: Diagnosis not present

## 2023-09-09 DIAGNOSIS — R6881 Early satiety: Secondary | ICD-10-CM | POA: Diagnosis not present

## 2023-09-09 DIAGNOSIS — M25512 Pain in left shoulder: Secondary | ICD-10-CM | POA: Diagnosis not present

## 2023-09-09 DIAGNOSIS — R634 Abnormal weight loss: Secondary | ICD-10-CM | POA: Diagnosis not present

## 2023-09-09 DIAGNOSIS — K5900 Constipation, unspecified: Secondary | ICD-10-CM | POA: Diagnosis not present

## 2023-09-30 DIAGNOSIS — Z124 Encounter for screening for malignant neoplasm of cervix: Secondary | ICD-10-CM | POA: Diagnosis not present

## 2023-09-30 DIAGNOSIS — Z01419 Encounter for gynecological examination (general) (routine) without abnormal findings: Secondary | ICD-10-CM | POA: Diagnosis not present

## 2023-09-30 DIAGNOSIS — Z1151 Encounter for screening for human papillomavirus (HPV): Secondary | ICD-10-CM | POA: Diagnosis not present

## 2023-10-18 DIAGNOSIS — Z1231 Encounter for screening mammogram for malignant neoplasm of breast: Secondary | ICD-10-CM | POA: Diagnosis not present

## 2023-10-25 DIAGNOSIS — S43432A Superior glenoid labrum lesion of left shoulder, initial encounter: Secondary | ICD-10-CM | POA: Diagnosis not present

## 2023-10-27 DIAGNOSIS — M25512 Pain in left shoulder: Secondary | ICD-10-CM | POA: Diagnosis not present

## 2023-11-03 DIAGNOSIS — M25512 Pain in left shoulder: Secondary | ICD-10-CM | POA: Diagnosis not present

## 2023-11-04 DIAGNOSIS — R6881 Early satiety: Secondary | ICD-10-CM | POA: Diagnosis not present

## 2023-11-04 DIAGNOSIS — K219 Gastro-esophageal reflux disease without esophagitis: Secondary | ICD-10-CM | POA: Diagnosis not present

## 2023-11-04 DIAGNOSIS — K2930 Chronic superficial gastritis without bleeding: Secondary | ICD-10-CM | POA: Diagnosis not present

## 2023-11-04 DIAGNOSIS — R634 Abnormal weight loss: Secondary | ICD-10-CM | POA: Diagnosis not present

## 2023-11-04 DIAGNOSIS — K2289 Other specified disease of esophagus: Secondary | ICD-10-CM | POA: Diagnosis not present

## 2023-11-04 DIAGNOSIS — K3189 Other diseases of stomach and duodenum: Secondary | ICD-10-CM | POA: Diagnosis not present

## 2023-11-09 DIAGNOSIS — M25512 Pain in left shoulder: Secondary | ICD-10-CM | POA: Diagnosis not present

## 2023-11-10 DIAGNOSIS — M25512 Pain in left shoulder: Secondary | ICD-10-CM | POA: Diagnosis not present

## 2023-11-22 DIAGNOSIS — S43432A Superior glenoid labrum lesion of left shoulder, initial encounter: Secondary | ICD-10-CM | POA: Diagnosis not present

## 2023-11-24 ENCOUNTER — Ambulatory Visit

## 2023-11-24 DIAGNOSIS — M25512 Pain in left shoulder: Secondary | ICD-10-CM | POA: Diagnosis not present

## 2023-11-24 DIAGNOSIS — H524 Presbyopia: Secondary | ICD-10-CM | POA: Diagnosis not present

## 2023-11-24 DIAGNOSIS — H43811 Vitreous degeneration, right eye: Secondary | ICD-10-CM | POA: Diagnosis not present

## 2023-11-29 DIAGNOSIS — M25512 Pain in left shoulder: Secondary | ICD-10-CM | POA: Diagnosis not present

## 2023-12-15 DIAGNOSIS — M25512 Pain in left shoulder: Secondary | ICD-10-CM | POA: Diagnosis not present

## 2023-12-20 DIAGNOSIS — M25512 Pain in left shoulder: Secondary | ICD-10-CM | POA: Diagnosis not present

## 2023-12-24 DIAGNOSIS — F331 Major depressive disorder, recurrent, moderate: Secondary | ICD-10-CM | POA: Diagnosis not present
